# Patient Record
Sex: Female | Born: 2000 | Race: White | Hispanic: No | Marital: Single | Smoking: Never smoker
Health system: Southern US, Community
[De-identification: ages and names within clinical notes are randomized; demographics above are authoritative.]

## PROBLEM LIST (undated history)

## (undated) ENCOUNTER — Inpatient Hospital Stay (HOSPITAL_COMMUNITY): Payer: Self-pay

## (undated) DIAGNOSIS — Z789 Other specified health status: Secondary | ICD-10-CM

## (undated) HISTORY — DX: Other specified health status: Z78.9

---
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
## 2018-01-30 DIAGNOSIS — Z01419 Encounter for gynecological examination (general) (routine) without abnormal findings: Secondary | ICD-10-CM | POA: Diagnosis not present

## 2018-01-30 DIAGNOSIS — Z6839 Body mass index (BMI) 39.0-39.9, adult: Secondary | ICD-10-CM | POA: Diagnosis not present

## 2018-01-30 DIAGNOSIS — Z113 Encounter for screening for infections with a predominantly sexual mode of transmission: Secondary | ICD-10-CM | POA: Diagnosis not present

## 2018-05-07 DIAGNOSIS — Z3202 Encounter for pregnancy test, result negative: Secondary | ICD-10-CM | POA: Diagnosis not present

## 2018-05-07 DIAGNOSIS — Z30017 Encounter for initial prescription of implantable subdermal contraceptive: Secondary | ICD-10-CM | POA: Diagnosis not present

## 2018-12-03 DIAGNOSIS — F418 Other specified anxiety disorders: Secondary | ICD-10-CM | POA: Diagnosis not present

## 2019-01-08 ENCOUNTER — Other Ambulatory Visit: Payer: Self-pay

## 2019-01-08 DIAGNOSIS — U071 COVID-19: Secondary | ICD-10-CM

## 2019-01-09 LAB — NOVEL CORONAVIRUS, NAA: SARS-CoV-2, NAA: NOT DETECTED

## 2019-02-04 DIAGNOSIS — F418 Other specified anxiety disorders: Secondary | ICD-10-CM | POA: Diagnosis not present

## 2019-02-04 DIAGNOSIS — N898 Other specified noninflammatory disorders of vagina: Secondary | ICD-10-CM | POA: Diagnosis not present

## 2019-02-04 DIAGNOSIS — Z309 Encounter for contraceptive management, unspecified: Secondary | ICD-10-CM | POA: Diagnosis not present

## 2019-02-04 DIAGNOSIS — Z01419 Encounter for gynecological examination (general) (routine) without abnormal findings: Secondary | ICD-10-CM | POA: Diagnosis not present

## 2019-02-04 DIAGNOSIS — Z23 Encounter for immunization: Secondary | ICD-10-CM | POA: Diagnosis not present

## 2019-02-04 DIAGNOSIS — Z113 Encounter for screening for infections with a predominantly sexual mode of transmission: Secondary | ICD-10-CM | POA: Diagnosis not present

## 2019-02-04 DIAGNOSIS — Z6832 Body mass index (BMI) 32.0-32.9, adult: Secondary | ICD-10-CM | POA: Diagnosis not present

## 2019-04-07 DIAGNOSIS — Z23 Encounter for immunization: Secondary | ICD-10-CM | POA: Diagnosis not present

## 2019-05-21 ENCOUNTER — Encounter: Payer: Self-pay | Admitting: Family Medicine

## 2019-05-22 ENCOUNTER — Encounter: Payer: Self-pay | Admitting: Family Medicine

## 2019-06-27 DIAGNOSIS — Z113 Encounter for screening for infections with a predominantly sexual mode of transmission: Secondary | ICD-10-CM | POA: Diagnosis not present

## 2019-06-27 DIAGNOSIS — F418 Other specified anxiety disorders: Secondary | ICD-10-CM | POA: Diagnosis not present

## 2019-06-27 DIAGNOSIS — Z309 Encounter for contraceptive management, unspecified: Secondary | ICD-10-CM | POA: Diagnosis not present

## 2019-06-27 DIAGNOSIS — E669 Obesity, unspecified: Secondary | ICD-10-CM | POA: Diagnosis not present

## 2019-06-27 DIAGNOSIS — N926 Irregular menstruation, unspecified: Secondary | ICD-10-CM | POA: Diagnosis not present

## 2019-07-10 DIAGNOSIS — Z309 Encounter for contraceptive management, unspecified: Secondary | ICD-10-CM | POA: Diagnosis not present

## 2019-07-10 DIAGNOSIS — Z3046 Encounter for surveillance of implantable subdermal contraceptive: Secondary | ICD-10-CM | POA: Diagnosis not present

## 2019-09-03 ENCOUNTER — Emergency Department (HOSPITAL_COMMUNITY)
Admission: EM | Admit: 2019-09-03 | Discharge: 2019-09-04 | Disposition: A | Discharge status: Home / Self Care | Payer: BC Managed Care – PPO | Attending: Emergency Medicine | Admitting: Emergency Medicine

## 2019-09-03 ENCOUNTER — Encounter (HOSPITAL_COMMUNITY): Payer: Self-pay

## 2019-09-03 DIAGNOSIS — R112 Nausea with vomiting, unspecified: Secondary | ICD-10-CM | POA: Insufficient documentation

## 2019-09-03 DIAGNOSIS — R1013 Epigastric pain: Secondary | ICD-10-CM | POA: Diagnosis not present

## 2019-09-03 DIAGNOSIS — R197 Diarrhea, unspecified: Secondary | ICD-10-CM

## 2019-09-03 DIAGNOSIS — R1011 Right upper quadrant pain: Secondary | ICD-10-CM | POA: Diagnosis not present

## 2019-09-03 DIAGNOSIS — R1012 Left upper quadrant pain: Secondary | ICD-10-CM | POA: Diagnosis not present

## 2019-09-03 LAB — COMPREHENSIVE METABOLIC PANEL
ALT: 14 U/L (ref 0–44)
AST: 20 U/L (ref 15–41)
Albumin: 3.7 g/dL (ref 3.5–5.0)
Alkaline Phosphatase: 69 U/L (ref 38–126)
Anion gap: 12 (ref 5–15)
BUN: 13 mg/dL (ref 6–20)
CO2: 22 mmol/L (ref 22–32)
Calcium: 9.1 mg/dL (ref 8.9–10.3)
Chloride: 106 mmol/L (ref 98–111)
Creatinine, Ser: 0.97 mg/dL (ref 0.44–1.00)
GFR calc Af Amer: >60 mL/min (ref >60)
GFR calc non Af Amer: >60 mL/min (ref >60)
Glucose, Bld: 141 mg/dL — ABNORMAL HIGH (ref 70–99)
Potassium: 4 mmol/L (ref 3.5–5.1)
Sodium: 140 mmol/L (ref 135–145)
Total Bilirubin: 0.4 mg/dL (ref 0.3–1.2)
Total Protein: 6.6 g/dL (ref 6.5–8.1)

## 2019-09-03 LAB — CBC
HCT: 45.4 % (ref 36.0–46.0)
Hemoglobin: 14.4 g/dL (ref 12.0–15.0)
MCH: 28.2 pg (ref 26.0–34.0)
MCHC: 31.7 g/dL (ref 30.0–36.0)
MCV: 89 fL (ref 80.0–100.0)
Platelets: 324 10*3/uL (ref 150–400)
RBC: 5.1 MIL/uL (ref 3.87–5.11)
RDW: 12.8 % (ref 11.5–15.5)
WBC: 15.9 10*3/uL — ABNORMAL HIGH (ref 4.0–10.5)
nRBC: 0 % (ref 0.0–0.2)

## 2019-09-03 LAB — I-STAT BETA HCG BLOOD, ED (MC, WL, AP ONLY): I-stat hCG, quantitative: <5 m[IU]/mL (ref <5)

## 2019-09-03 LAB — LIPASE, BLOOD: Lipase: 27 U/L (ref 11–51)

## 2019-09-03 MED ORDER — SODIUM CHLORIDE 0.9% FLUSH: 3.0000 mL | Freq: Once | INTRAVENOUS | Status: DC

## 2019-09-03 MED ORDER — ONDANSETRON 4 MG PO TBDP
4.0000 mg | ORAL_TABLET | Freq: Once | ORAL | Status: AC
  Administered 2019-09-03: 4 mg via ORAL
  Filled 2019-09-03: qty 1

## 2019-09-03 NOTE — ED Triage Notes (Signed)
Pt reports that she has been having upper abd pain all day with vomiting.

## 2019-09-03 NOTE — ED Notes (Signed)
Called pt for triage no answer x1

## 2019-09-04 ENCOUNTER — Emergency Department (HOSPITAL_COMMUNITY): Payer: BC Managed Care – PPO

## 2019-09-04 DIAGNOSIS — R1011 Right upper quadrant pain: Secondary | ICD-10-CM | POA: Diagnosis not present

## 2019-09-04 MED ORDER — ALUM & MAG HYDROXIDE-SIMETH 200-200-20 MG/5ML PO SUSP
30.0000 mL | Freq: Once | ORAL | Status: AC
  Administered 2019-09-04: 30 mL via ORAL
  Filled 2019-09-04: qty 30

## 2019-09-04 MED ORDER — MORPHINE SULFATE (PF) 4 MG/ML IV SOLN
4.0000 mg | Freq: Once | INTRAVENOUS | Status: AC
  Administered 2019-09-04: 4 mg via INTRAVENOUS
  Filled 2019-09-04: qty 1

## 2019-09-04 MED ORDER — ONDANSETRON HCL 4 MG/2ML IJ SOLN
4.0000 mg | Freq: Once | INTRAMUSCULAR | Status: AC
  Administered 2019-09-04: 4 mg via INTRAVENOUS
  Filled 2019-09-04: qty 2

## 2019-09-04 MED ORDER — ONDANSETRON 4 MG PO TBDP
4.0000 mg | ORAL_TABLET | Freq: Three times a day (TID) | ORAL | 0 refills | Status: DC | PRN
Start: 2019-09-04 — End: 2020-10-22

## 2019-09-04 MED ORDER — SODIUM CHLORIDE 0.9 % IV BOLUS
1000.0000 mL | Freq: Once | INTRAVENOUS | Status: AC
  Administered 2019-09-04: 1000 mL via INTRAVENOUS

## 2019-09-04 NOTE — Discharge Instructions (Signed)
Try zantac or pepcid twice a day.  Try to avoid things that may make this worse, most commonly these are spicy foods tomato based products fatty foods chocolate and peppermint.  Alcohol and tobacco can also make this worse.  Return to the emergency department for sudden worsening pain or inability to eat or drink.  You can take Imodium for diarrhea.

## 2019-09-04 NOTE — ED Provider Notes (Signed)
[facility] Provider Note   CSN: [id number] Arrival date & time: 09/03/19  1921     History Chief Complaint  Patient presents with  . Abdominal Pain    Leslie Leonard is a 19 y.o. female.  19 yo F with a chief complaints of epigastric abdominal pain.  Going on all day today.  Had nausea and vomiting and fever with it.  Pain is improved somewhat.  Was really severe earlier today.  Like her vomiting was continuous.  She had a couple episodes of loose stools as well.  No sick contacts no suspicious food intake.  Her mother has a history of gallbladder disease and had her gallbladder removed in her early 71s.  Was concerned about this and so sent her here for evaluation.  Seem to improve with sitting up and was worse recumbent.  The history is provided by the patient.  Abdominal Pain Pain location:  RUQ, epigastric and LUQ Pain quality: sharp and shooting   Pain radiates to:  Does not radiate Pain severity:  Moderate Onset quality:  Sudden Duration:  1 day Timing:  Constant Progression:  Partially resolved Chronicity:  Recurrent Relieved by:  Position changes Worsened by:  Position changes Ineffective treatments:  None tried Associated symptoms: diarrhea, nausea and vomiting   Associated symptoms: no chest pain, no chills, no dysuria, no fever and no shortness of breath        History reviewed. No pertinent past medical history.  There are no problems to display for this patient.   History reviewed. No pertinent surgical history.   OB History   No obstetric history on file.     No family history on file.  Social History   Tobacco Use  . Smoking status: Never Smoker  . Smokeless tobacco: Never Used  Substance Use Topics  . Alcohol use: Not on file  . Drug use: Not on file    Home Medications Prior to Admission medications   Medication Sig Start Date End Date Taking? Authorizing Provider  azithromycin (ZITHROMAX Z-PAK)  250 MG tablet Take 2 tablets (500 mg) on  Day 1,  followed by 1 tablet (250 mg) once daily on Days 2 through 5. 02/14/17   Mikey Kirschner, MD  ondansetron (ZOFRAN ODT) 4 MG disintegrating tablet Take 1 tablet (4 mg total) by mouth every 8 (eight) hours as needed for nausea or vomiting. 09/04/19   Deno Etienne, DO    Allergies    Patient has no known allergies.  Review of Systems   Review of Systems  Constitutional: Negative for chills and fever.  HENT: Negative for congestion and rhinorrhea.   Eyes: Negative for redness and visual disturbance.  Respiratory: Negative for shortness of breath and wheezing.   Cardiovascular: Negative for chest pain and palpitations.  Gastrointestinal: Positive for abdominal pain, diarrhea, nausea and vomiting.  Genitourinary: Negative for dysuria and urgency.  Musculoskeletal: Negative for arthralgias and myalgias.  Skin: Negative for pallor and wound.  Neurological: Negative for dizziness and headaches.    Physical Exam Updated Vital Signs BP 124/67   Pulse 97   Temp (!) 100.5 F (38.1 C)   Resp 16   SpO2 96%   Physical Exam Vitals and nursing note reviewed.  Constitutional:      General: She is not in acute distress.    Appearance: She is well-developed. She is not diaphoretic.  HENT:     Head: Normocephalic and atraumatic.  Eyes:  Pupils: Pupils are equal, round, and reactive to light.  Cardiovascular:     Rate and Rhythm: Normal rate and regular rhythm.     Heart sounds: No murmur. No friction rub. No gallop.   Pulmonary:     Effort: Pulmonary effort is normal.     Breath sounds: No wheezing or rales.  Abdominal:     General: There is no distension.     Palpations: Abdomen is soft.     Tenderness: There is abdominal tenderness.     Comments: Mild tenderness to the upper abdomen.  Negative Murphy sign.  Musculoskeletal:        General: No tenderness.     Cervical back: Normal range of motion and neck supple.  Skin:    General:  Skin is warm and dry.  Neurological:     Mental Status: She is alert and oriented to person, place, and time.  Psychiatric:        Behavior: Behavior normal.     ED Results / Procedures / Treatments   Labs (all labs ordered are listed, but only abnormal results are displayed) Labs Reviewed  COMPREHENSIVE METABOLIC PANEL - Abnormal; Notable for the following components:      Result Value   Glucose, Bld 141 (*)    All other components within normal limits  CBC - Abnormal; Notable for the following components:   WBC 15.9 (*)    All other components within normal limits  LIPASE, BLOOD  URINALYSIS, ROUTINE W REFLEX MICROSCOPIC  I-STAT BETA HCG BLOOD, ED ([facility], [facility], [facility] ONLY)    EKG None  Radiology US Abdomen Limited RUQ  Result Date: 09/04/2019 CLINICAL DATA:  Right upper quadrant pain for 2 days EXAM: ULTRASOUND ABDOMEN LIMITED RIGHT UPPER QUADRANT COMPARISON:  None. FINDINGS: Gallbladder: No gallstones or wall thickening visualized. No sonographic Murphy sign noted by sonographer. Common bile duct: Diameter: 5 mm Liver: No focal lesion identified. Within normal limits in parenchymal echogenicity. Portal vein is patent on color Doppler imaging with normal direction of blood flow towards the liver. Other: None. IMPRESSION: Normal examination Electronically Signed   By: Prudencio Pair M.D.   On: 09/04/2019 01:51    Procedures Procedures (including critical care time)  Medications Ordered in ED Medications  sodium chloride flush (NS) 0.9 % injection 3 mL (has no administration in time range)  ondansetron (ZOFRAN-ODT) disintegrating tablet 4 mg (4 mg Oral Given 09/03/19 2140)  morphine 4 MG/ML injection 4 mg (4 mg Intravenous Given 09/04/19 0114)  ondansetron (ZOFRAN) injection 4 mg (4 mg Intravenous Given 09/04/19 0114)  sodium chloride 0.9 % bolus 1,000 mL (1,000 mLs Intravenous New Bag/Given 09/04/19 0113)  alum & mag hydroxide-simeth (MAALOX/MYLANTA) 200-200-20 MG/5ML suspension 30 mL  (30 mLs Oral Given 09/04/19 0114)    ED Course  I have reviewed the triage vital signs and the nursing notes.  Pertinent labs & imaging results that were available during my care of the patient were reviewed by me and considered in my medical decision making (see chart for details).    MDM Rules/Calculators/A&P                      19 yo F with a chief complaints of epigastric abdominal pain nausea vomiting diarrhea and fever.  Going on for about 12 hours now.  Patient is febrile here 100.5.  Has a leukocytosis.  She is complaining of pain mostly in the right upper quadrant.  LFTs and lipase are unremarkable.  Will obtain right upper quadrant ultrasound.  Ultrasound is negative for gallstones or other signs of gallbladder infection.  Patient continues to feel much better.  Will treat as possible viral syndrome.  Have her follow-up with her family doctor.  2:13 AM:  I have discussed the diagnosis/risks/treatment options with the patient and believe the pt to be eligible for discharge home to follow-up with PCP. We also discussed returning to the ED immediately if new or worsening sx occur. We discussed the sx which are most concerning (e.g., sudden worsening pain, fever, inability to tolerate by mouth) that necessitate immediate return. Medications administered to the patient during their visit and any new prescriptions provided to the patient are listed below.  Medications given during this visit Medications  sodium chloride flush (NS) 0.9 % injection 3 mL (has no administration in time range)  ondansetron (ZOFRAN-ODT) disintegrating tablet 4 mg (4 mg Oral Given 09/03/19 2140)  morphine 4 MG/ML injection 4 mg (4 mg Intravenous Given 09/04/19 0114)  ondansetron (ZOFRAN) injection 4 mg (4 mg Intravenous Given 09/04/19 0114)  sodium chloride 0.9 % bolus 1,000 mL (1,000 mLs Intravenous New Bag/Given 09/04/19 0113)  alum & mag hydroxide-simeth (MAALOX/MYLANTA) 200-200-20 MG/5ML suspension 30 mL (30  mLs Oral Given 09/04/19 0114)     The patient appears reasonably screen and/or stabilized for discharge and I doubt any other medical condition or other [facility] requiring further screening, evaluation, or treatment in the ED at this time prior to discharge.   Final Clinical Impression(s) / ED Diagnoses Final diagnoses:  Abdominal pain, RUQ  Nausea vomiting and diarrhea    Rx / DC Orders ED Discharge Orders         Ordered    ondansetron (ZOFRAN ODT) 4 MG disintegrating tablet  Every 8 hours PRN     09/04/19 Mathews, Sheridan, DO 09/04/19 [id number]

## 2019-09-04 NOTE — ED Notes (Signed)
Patient verbalizes understanding of discharge instructions. Opportunity for questioning and answers were provided. Armband removed by staff, pt discharged from ED ambulatory.   

## 2019-09-04 NOTE — ED Notes (Signed)
Pt taken to US via stretcher in stable condition with face mask in place. 

## 2019-09-05 ENCOUNTER — Encounter: Payer: Self-pay | Admitting: Family Medicine

## 2019-12-08 ENCOUNTER — Ambulatory Visit: Payer: BC Managed Care – PPO | Admitting: Family Medicine

## 2019-12-08 ENCOUNTER — Other Ambulatory Visit: Payer: Self-pay

## 2019-12-08 ENCOUNTER — Encounter: Payer: Self-pay | Admitting: Family Medicine

## 2019-12-08 VITALS — BP 118/84 | HR 85 | Temp 98.4°F | Ht 63.5 in | Wt 189.6 lb

## 2019-12-08 DIAGNOSIS — L239 Allergic contact dermatitis, unspecified cause: Secondary | ICD-10-CM | POA: Insufficient documentation

## 2019-12-08 MED ORDER — TRIAMCINOLONE ACETONIDE 0.1 % EX CREA
1.0000 | TOPICAL_CREAM | Freq: Two times a day (BID) | CUTANEOUS | 0 refills | Status: DC
Start: 2019-12-08 — End: 2020-10-22

## 2019-12-08 NOTE — Progress Notes (Signed)
   Patient ID: Leslie Leonard, female    DOB: [birth year], 19 y.o.   MRN: [id number]   Chief Complaint  Patient presents with  . Rash   Subjective:    HPI  Patient comes in today with complaints of a rash on both arms and chest x several days. Rash itches and seems to be spreading. Patient unaware of any changes in routine.   Medical History Carle has no past medical history on file.   Outpatient Encounter Medications as of 12/08/2019  Medication Sig  . etonogestrel (NEXPLANON) 68 MG IMPL implant Nexplanon 68 mg subdermal implant  Inject 1 implant by subcutaneous route.  . ondansetron (ZOFRAN ODT) 4 MG disintegrating tablet Take 1 tablet (4 mg total) by mouth every 8 (eight) hours as needed for nausea or vomiting. (Patient not taking: Reported on 12/08/2019)  . [DISCONTINUED] azithromycin (ZITHROMAX Z-PAK) 250 MG tablet Take 2 tablets (500 mg) on  Day 1,  followed by 1 tablet (250 mg) once daily on Days 2 through 5.   No facility-administered encounter medications on file as of 12/08/2019.     Review of Systems  Constitutional: Negative for fever.  Skin: Positive for rash.       Raised bumps on both arms and right shoulder and a small place on back of neck. Itches. Does not look like bites or poison oak/ivy.  Allergic/Immunologic: Negative for environmental allergies and food allergies.     Vitals BP 118/84   Pulse 85   Temp 98.4 F (36.9 C)   Ht 5' 3.5" (1.613 m)   Wt 189 lb 9.6 oz (86 kg)   SpO2 99%   BMI 33.06 kg/m   Objective:   Physical Exam Constitutional:      Appearance: Normal appearance.  Pulmonary:     Effort: Pulmonary effort is normal.  Skin:    General: Skin is warm and dry.     Findings: Rash present.     Comments: Raised bumps on bil arms and right shoulder. Slight erythema on some of the bumps. No drainage, no warmth to touch. Denies changing soap, detergent, new clothes, new foods, new medications. No one in the home has this (only her).    Neurological:     Mental Status: She is alert.   Does not look like bites or poison ivy/oak.    Assessment and Plan   1. Allergic dermatitis   Will keep food log to see if she is allergic to something she has been eating. Triamcinolone 0.1% cream ordered to use topically twice per day.  Agrees with plan of care discussed today. Understands warning signs to seek further care: rash/itching worsen, or if develops fever. Understands to follow-up if symptoms do not improve or if anything changes and we will discuss next steps for treatment options, such as a short burst of prednisone.  Encouraged her to try to figure out the cause so it can be avoided.  Pecolia Ades, NP

## 2019-12-08 NOTE — Patient Instructions (Signed)
Use the Kenalog twice per day. If you are  Not better in one week, return to discuss treatment options. Keep food log to make sure you are not allergic to something you've always eaten.    Contact Dermatitis Dermatitis is redness, soreness, and swelling (inflammation) of the skin. Contact dermatitis is a reaction to something that touches the skin. There are two types of contact dermatitis:  Irritant contact dermatitis. This happens when something bothers (irritates) your skin, like soap.  Allergic contact dermatitis. This is caused when you are exposed to something that you are allergic to, such as poison ivy. What are the causes?  Common causes of irritant contact dermatitis include: ? Makeup. ? Soaps. ? Detergents. ? Bleaches. ? Acids. ? Metals, such as nickel.  Common causes of allergic contact dermatitis include: ? Plants. ? Chemicals. ? Jewelry. ? Latex. ? Medicines. ? Preservatives in products, such as clothing. What increases the risk?  Having a job that exposes you to things that bother your skin.  Having asthma or eczema. What are the signs or symptoms? Symptoms may happen anywhere the irritant has touched your skin. Symptoms include:  Dry or flaky skin.  Redness.  Cracks.  Itching.  Pain or a burning feeling.  Blisters.  Blood or clear fluid draining from skin cracks. With allergic contact dermatitis, swelling may occur. This may happen in places such as the eyelids, mouth, or genitals. How is this treated?  This condition is treated by checking for the cause of the reaction and protecting your skin. Treatment may also include: ? Steroid creams, ointments, or medicines. ? Antibiotic medicines or other ointments, if you have a skin infection. ? Lotion or medicines to help with itching. ? A bandage (dressing). Follow these instructions at home: Skin care  Moisturize your skin as needed.  Put cool cloths on your skin.  Put a baking soda paste  on your skin. Stir water into baking soda until it looks like a paste.  Do not scratch your skin.  Avoid having things rub up against your skin.  Avoid the use of soaps, perfumes, and dyes. Medicines  Take or apply over-the-counter and prescription medicines only as told by your doctor.  If you were prescribed an antibiotic medicine, take or apply it as told by your doctor. Do not stop using it even if your condition starts to get better. Bathing  Take a bath with: ? Epsom salts. ? Baking soda. ? Colloidal oatmeal.  Bathe less often.  Bathe in warm water. Avoid using hot water. Bandage care  If you were given a bandage, change it as told by your health care provider.  Wash your hands with soap and water before and after you change your bandage. If soap and water are not available, use hand sanitizer. General instructions  Avoid the things that caused your reaction. If you do not know what caused it, keep a journal. Write down: ? What you eat. ? What skin products you use. ? What you drink. ? What you wear in the area that has symptoms. This includes jewelry.  Check the affected areas every day for signs of infection. Check for: ? More redness, swelling, or pain. ? More fluid or blood. ? Warmth. ? Pus or a bad smell.  Keep all follow-up visits as told by your doctor. This is important. Contact a doctor if:  You do not get better with treatment.  Your condition gets worse.  You have signs of infection, such as: ?  More swelling. ? Tenderness. ? More redness. ? Soreness. ? Warmth.  You have a fever.  You have new symptoms. Get help right away if:  You have a very bad headache.  You have neck pain.  Your neck is stiff.  You throw up (vomit).  You feel very sleepy.  You see red streaks coming from the area.  Your bone or joint near the area hurts after the skin has healed.  The area turns darker.  You have trouble breathing. Summary  Dermatitis  is redness, soreness, and swelling of the skin.  Symptoms may occur where the irritant has touched you.  Treatment may include medicines and skin care.  If you do not know what caused your reaction, keep a journal.  Contact a doctor if your condition gets worse or you have signs of infection. This information is not intended to replace advice given to you by your health care provider. Make sure you discuss any questions you have with your health care provider. Document Revised: 07/31/2018 Document Reviewed: 10/24/2017 Elsevier Patient Education  [place].

## 2020-02-11 DIAGNOSIS — F418 Other specified anxiety disorders: Secondary | ICD-10-CM | POA: Diagnosis not present

## 2020-02-11 DIAGNOSIS — Z6835 Body mass index (BMI) 35.0-35.9, adult: Secondary | ICD-10-CM | POA: Diagnosis not present

## 2020-02-11 DIAGNOSIS — Z113 Encounter for screening for infections with a predominantly sexual mode of transmission: Secondary | ICD-10-CM | POA: Diagnosis not present

## 2020-02-11 DIAGNOSIS — Z309 Encounter for contraceptive management, unspecified: Secondary | ICD-10-CM | POA: Diagnosis not present

## 2020-02-11 DIAGNOSIS — N760 Acute vaginitis: Secondary | ICD-10-CM | POA: Diagnosis not present

## 2020-02-11 DIAGNOSIS — Z01419 Encounter for gynecological examination (general) (routine) without abnormal findings: Secondary | ICD-10-CM | POA: Diagnosis not present

## 2020-03-22 ENCOUNTER — Encounter: Payer: BC Managed Care – PPO | Admitting: Adult Health

## 2020-03-25 DIAGNOSIS — F418 Other specified anxiety disorders: Secondary | ICD-10-CM | POA: Diagnosis not present

## 2020-03-25 DIAGNOSIS — N911 Secondary amenorrhea: Secondary | ICD-10-CM | POA: Diagnosis not present

## 2020-03-29 DIAGNOSIS — Z3481 Encounter for supervision of other normal pregnancy, first trimester: Secondary | ICD-10-CM | POA: Diagnosis not present

## 2020-03-29 DIAGNOSIS — Z3685 Encounter for antenatal screening for Streptococcus B: Secondary | ICD-10-CM | POA: Diagnosis not present

## 2020-03-29 LAB — OB RESULTS CONSOLE HIV ANTIBODY (ROUTINE TESTING): HIV: NONREACTIVE

## 2020-03-29 LAB — OB RESULTS CONSOLE HEPATITIS B SURFACE ANTIGEN: Hepatitis B Surface Ag: NEGATIVE

## 2020-03-29 LAB — OB RESULTS CONSOLE ABO/RH: RH Type: NEGATIVE

## 2020-03-29 LAB — OB RESULTS CONSOLE ANTIBODY SCREEN: Antibody Screen: NEGATIVE

## 2020-03-29 LAB — OB RESULTS CONSOLE GC/CHLAMYDIA
Chlamydia: NEGATIVE
Gonorrhea: NEGATIVE

## 2020-03-29 LAB — OB RESULTS CONSOLE RUBELLA ANTIBODY, IGM: Rubella: IMMUNE

## 2020-03-29 LAB — OB RESULTS CONSOLE RPR: RPR: NONREACTIVE

## 2020-04-02 DIAGNOSIS — Z3400 Encounter for supervision of normal first pregnancy, unspecified trimester: Secondary | ICD-10-CM | POA: Diagnosis not present

## 2020-04-02 DIAGNOSIS — Z113 Encounter for screening for infections with a predominantly sexual mode of transmission: Secondary | ICD-10-CM | POA: Diagnosis not present

## 2020-04-21 DIAGNOSIS — Z36 Encounter for antenatal screening for chromosomal anomalies: Secondary | ICD-10-CM | POA: Diagnosis not present

## 2020-04-21 DIAGNOSIS — Z3A12 12 weeks gestation of pregnancy: Secondary | ICD-10-CM | POA: Diagnosis not present

## 2020-04-21 DIAGNOSIS — Z3682 Encounter for antenatal screening for nuchal translucency: Secondary | ICD-10-CM | POA: Diagnosis not present

## 2020-04-24 NOTE — L&D Delivery Note (Addendum)
Delivery Note At 7:08 AM a viable female was delivered via Vaginal, Spontaneous (Presentation: Right Occiput Anterior).  APGAR: 9, 9; weight  .   Placenta status: Spontaneous, Intact.  Cord: 3 vessels with the following complications: None.  Cord pH: collected  Anesthesia: Epidural Episiotomy: None Lacerations: 2nd degree, RV exam confirms Suture Repair: 2.0 Est. Blood Loss (mL):  300 cc  Mom to postpartum.  Baby to Couplet care / Skin to Skin.  Carlyon Shadow 10/22/2020, 7:51 AM

## 2020-06-02 DIAGNOSIS — Z3A18 18 weeks gestation of pregnancy: Secondary | ICD-10-CM | POA: Diagnosis not present

## 2020-06-02 DIAGNOSIS — Z363 Encounter for antenatal screening for malformations: Secondary | ICD-10-CM | POA: Diagnosis not present

## 2020-07-26 DIAGNOSIS — Z362 Encounter for other antenatal screening follow-up: Secondary | ICD-10-CM | POA: Diagnosis not present

## 2020-07-26 DIAGNOSIS — O99212 Obesity complicating pregnancy, second trimester: Secondary | ICD-10-CM | POA: Diagnosis not present

## 2020-07-26 DIAGNOSIS — Z3A26 26 weeks gestation of pregnancy: Secondary | ICD-10-CM | POA: Diagnosis not present

## 2020-07-26 DIAGNOSIS — Z3480 Encounter for supervision of other normal pregnancy, unspecified trimester: Secondary | ICD-10-CM | POA: Diagnosis not present

## 2020-09-28 DIAGNOSIS — Z3685 Encounter for antenatal screening for Streptococcus B: Secondary | ICD-10-CM | POA: Diagnosis not present

## 2020-09-28 LAB — OB RESULTS CONSOLE GBS: GBS: NEGATIVE

## 2020-10-21 ENCOUNTER — Inpatient Hospital Stay (HOSPITAL_COMMUNITY)
Admission: AD | Admit: 2020-10-21 | Discharge: 2020-10-24 | DRG: 807 | Disposition: A | Discharge status: Home / Self Care | Payer: BC Managed Care – PPO | Attending: Obstetrics & Gynecology | Admitting: Obstetrics & Gynecology

## 2020-10-21 ENCOUNTER — Encounter (HOSPITAL_COMMUNITY): Payer: Self-pay | Admitting: *Deleted

## 2020-10-21 ENCOUNTER — Telehealth (HOSPITAL_COMMUNITY): Payer: Self-pay | Admitting: *Deleted

## 2020-10-21 ENCOUNTER — Encounter (HOSPITAL_COMMUNITY): Payer: Self-pay | Admitting: Obstetrics and Gynecology

## 2020-10-21 ENCOUNTER — Other Ambulatory Visit: Payer: Self-pay

## 2020-10-21 ENCOUNTER — Inpatient Hospital Stay (EMERGENCY_DEPARTMENT_HOSPITAL)
Admission: AD | Admit: 2020-10-21 | Discharge: 2020-10-21 | Disposition: A | Discharge status: Home / Self Care | Payer: BC Managed Care – PPO | Source: Home / Self Care | Attending: Obstetrics and Gynecology | Admitting: Obstetrics and Gynecology

## 2020-10-21 DIAGNOSIS — Z3A39 39 weeks gestation of pregnancy: Secondary | ICD-10-CM

## 2020-10-21 DIAGNOSIS — O368130 Decreased fetal movements, third trimester, not applicable or unspecified: Secondary | ICD-10-CM

## 2020-10-21 DIAGNOSIS — O471 False labor at or after 37 completed weeks of gestation: Secondary | ICD-10-CM | POA: Insufficient documentation

## 2020-10-21 DIAGNOSIS — Z20822 Contact with and (suspected) exposure to covid-19: Secondary | ICD-10-CM | POA: Diagnosis present

## 2020-10-21 DIAGNOSIS — Z6791 Unspecified blood type, Rh negative: Secondary | ICD-10-CM | POA: Diagnosis not present

## 2020-10-21 DIAGNOSIS — O368190 Decreased fetal movements, unspecified trimester, not applicable or unspecified: Secondary | ICD-10-CM

## 2020-10-21 DIAGNOSIS — O26893 Other specified pregnancy related conditions, third trimester: Secondary | ICD-10-CM | POA: Diagnosis present

## 2020-10-21 DIAGNOSIS — Z0371 Encounter for suspected problem with amniotic cavity and membrane ruled out: Secondary | ICD-10-CM

## 2020-10-21 DIAGNOSIS — Z79899 Other long term (current) drug therapy: Secondary | ICD-10-CM | POA: Insufficient documentation

## 2020-10-21 HISTORY — DX: Other specified health status: Z78.9

## 2020-10-21 LAB — TYPE AND SCREEN
ABO/RH(D): O NEG
Antibody Screen: NEGATIVE

## 2020-10-21 LAB — CBC
HCT: 37.3 % (ref 36.0–46.0)
Hemoglobin: 12.5 g/dL (ref 12.0–15.0)
MCH: 29.4 pg (ref 26.0–34.0)
MCHC: 33.5 g/dL (ref 30.0–36.0)
MCV: 87.8 fL (ref 80.0–100.0)
Platelets: 255 10*3/uL (ref 150–400)
RBC: 4.25 MIL/uL (ref 3.87–5.11)
RDW: 13.8 % (ref 11.5–15.5)
WBC: 11.8 10*3/uL — ABNORMAL HIGH (ref 4.0–10.5)
nRBC: 0 % (ref 0.0–0.2)

## 2020-10-21 LAB — POCT FERN TEST: POCT Fern Test: NEGATIVE

## 2020-10-21 MED ORDER — SOD CITRATE-CITRIC ACID 500-334 MG/5ML PO SOLN: 30.0000 mL | ORAL | Status: DC | PRN

## 2020-10-21 MED ORDER — OXYTOCIN-SODIUM CHLORIDE 30-0.9 UT/500ML-% IV SOLN: 1.0000 m[IU]/min | INTRAVENOUS | Status: DC

## 2020-10-21 MED ORDER — FLEET ENEMA 7-19 GM/118ML RE ENEM: 1.0000 | ENEMA | RECTAL | Status: DC | PRN

## 2020-10-21 MED ORDER — TERBUTALINE SULFATE 1 MG/ML IJ SOLN: 0.2500 mg | Freq: Once | INTRAMUSCULAR | Status: DC | PRN

## 2020-10-21 MED ORDER — OXYTOCIN-SODIUM CHLORIDE 30-0.9 UT/500ML-% IV SOLN
2.5000 [IU]/h | INTRAVENOUS | Status: DC
  Filled 2020-10-21: qty 500

## 2020-10-21 MED ORDER — OXYCODONE-ACETAMINOPHEN 5-325 MG PO TABS: 1.0000 | ORAL_TABLET | ORAL | Status: DC | PRN

## 2020-10-21 MED ORDER — OXYCODONE-ACETAMINOPHEN 5-325 MG PO TABS
2.0000 | ORAL_TABLET | ORAL | Status: DC | PRN
Start: 2020-10-21 — End: 2020-10-22

## 2020-10-21 MED ORDER — ONDANSETRON HCL 4 MG/2ML IJ SOLN: 4.0000 mg | Freq: Four times a day (QID) | INTRAMUSCULAR | Status: DC | PRN

## 2020-10-21 MED ORDER — ACETAMINOPHEN 325 MG PO TABS: 650.0000 mg | ORAL_TABLET | ORAL | Status: DC | PRN

## 2020-10-21 MED ORDER — LACTATED RINGERS IV SOLN
500.0000 mL | INTRAVENOUS | Status: DC | PRN
  Administered 2020-10-22: 500 mL via INTRAVENOUS

## 2020-10-21 MED ORDER — LIDOCAINE HCL (PF) 1 % IJ SOLN: 30.0000 mL | INTRAMUSCULAR | Status: DC | PRN

## 2020-10-21 MED ORDER — LACTATED RINGERS IV SOLN: INTRAVENOUS | Status: DC

## 2020-10-21 MED ORDER — OXYTOCIN BOLUS FROM INFUSION
333.0000 mL | Freq: Once | INTRAVENOUS | Status: AC
  Administered 2020-10-22: 333 mL via INTRAVENOUS

## 2020-10-21 NOTE — MAU Note (Signed)
Presents c/o decreased FM, states hasn't felt much movement since 1000 this morning.  Also states has had leaking of clear fluid and brownish vaginal discharge.  Reports LOF since 1100 this morning.  Denies VB.

## 2020-10-21 NOTE — H&P (Signed)
OB History and Physical   ICHELLE Leonard is a 20 y.o. female G1P0 at [redacted]w[redacted]d presenting for LOF at [redacted]w[redacted]d.  SROM confirmed in MAU and patient is admitted to labor and delivery.  She reports good fetal movement, denies bleeding, reports ctx q 6 min.  Pregnancy history notable for GBS negative (09/28/20), Rh negative (FOB tested and is also Rh negative   OB History     Gravida  1   Para      Term      Preterm      AB      Living         SAB      IAB      Ectopic      Multiple      Live Births             Past Medical History:  Diagnosis Date   Medical history non-contributory    No past surgical history on file. Family History: family history is not on file. Social History:  reports that she has never smoked. She has never used smokeless tobacco. She reports that she does not drink alcohol and does not use drugs.     Maternal Diabetes: No Genetic Screening: Normal Maternal Ultrasounds/Referrals: Normal Fetal Ultrasounds or other Referrals:  None Maternal Substance Abuse:  No Significant Maternal Medications:  None Significant Maternal Lab Results:  Group B Strep negative and Rh negative Other Comments:  None  Review of Systems Denies fever, chills, SOB, CP, HA, VC History Dilation: 2 Effacement (%): 60 Exam by:: Marcelyn Bruins, RN Blood pressure 139/77, pulse 96, temperature 98 F (36.7 C), resp. rate 18, height 5\' 3"  (1.6 m), weight 119.7 kg. Exam Physical Exam  Gen: alert, well appearing, no distress Chest: nonlabored breathing CV: no peripheral edema Abdomen: soft, nontender Ext: no evidence of DVT FHT cat 1  Prenatal labs: ABO, Rh: --/--/PENDING (06/30 2302) Antibody: PENDING (06/30 2302) Rubella: Immune (12/06 0000) RPR: Nonreactive (12/06 0000)  HBsAg: Negative (12/06 0000)  HIV: Non-reactive (12/06 0000)  GBS:   negative  Assessment/Plan: Admit to Labor and Delivery SROM confirmed in MAU, will augment with pitocin 2x2 GBS negative, no  PCN indicated Rh negative (FOB also Rh engative, Rhogam not indicated) Epidural when desired Anticipate vaginal delivery   Carlyon Shadow 10/21/2020, 11:26 PM

## 2020-10-21 NOTE — Telephone Encounter (Signed)
Preadmission screen  

## 2020-10-21 NOTE — MAU Note (Signed)
Pt reports she had a gush of fluid around 440pm. Clear fluid out has had several gushes since. Ctx q 6 min. Good fetal movement felt.

## 2020-10-21 NOTE — MAU Provider Note (Signed)
History     [id number]  Arrival date and time: 10/21/20 1338    Chief Complaint  Patient presents with   Decreased Fetal Movement   Rupture of Membranes     HPI Leslie Leonard is a 20 y.o. at [redacted]w[redacted]d with PMHx notable for Rh neg, who presents for decreased fetal movement and possible rupture of membranes.   Review of outside prenatal records from [facility] (in media tab): unremarkable course, Rh neg  Patient reports that earlier today she had a gush of clear fluid that soaked her underwear, was not a large amount but was noticaeable Has also had decreased fetal movement all day However since arriving to the MAU this has resolved Intermittent contractions Had vaginal spotting once earlier this week but no frank vaginal bleeding  O/Negative/-- (12/06 0000)  OB History     Gravida  1   Para      Term      Preterm      AB      Living         SAB      IAB      Ectopic      Multiple      Live Births              Past Medical History:  Diagnosis Date   Medical history non-contributory     History reviewed. No pertinent surgical history.  History reviewed. No pertinent family history.  Social History   Socioeconomic History   Marital status: Single    Spouse name: Not on file   Number of children: Not on file   Years of education: Not on file   Highest education level: Not on file  Occupational History   Not on file  Tobacco Use   Smoking status: Never   Smokeless tobacco: Never  Vaping Use   Vaping Use: Never used  Substance and Sexual Activity   Alcohol use: Never   Drug use: Never   Sexual activity: Yes  Other Topics Concern   Not on file  Social History Narrative   Not on file   Social Determinants of Health   Financial Resource Strain: Not on file  Food Insecurity: Not on file  Transportation Needs: Not on file  Physical Activity: Not on file  Stress: Not on file  Social Connections: Not on file  Intimate  Partner Violence: Not on file    No Known Allergies  No current facility-administered medications on file prior to encounter.   Current Outpatient Medications on File Prior to Encounter  Medication Sig Dispense Refill   Prenatal Vit-Fe Fumarate-FA (PRENATAL MULTIVITAMIN) TABS tablet Take 1 tablet by mouth daily at 12 noon.     etonogestrel (NEXPLANON) 68 MG IMPL implant Nexplanon 68 mg subdermal implant  Inject 1 implant by subcutaneous route.     ondansetron (ZOFRAN ODT) 4 MG disintegrating tablet Take 1 tablet (4 mg total) by mouth every 8 (eight) hours as needed for nausea or vomiting. 20 tablet 0   triamcinolone cream (KENALOG) 0.1 % Apply 1 application topically 2 (two) times daily. 30 g 0     ROS Pertinent positives and negative per HPI, all others reviewed and negative  Physical Exam   BP 125/75 (BP Location: Right Arm)   Pulse 98   Temp 98.3 F (36.8 C) (Oral)   Resp 17   Ht 5\' 3"  (1.6 m)   Wt 120 kg   SpO2 97%   BMI  46.87 kg/m   Patient Vitals for the past 24 hrs:  BP Temp Temp src Pulse Resp SpO2 Height Weight  10/21/20 1426 -- -- -- 98 17 -- -- --  10/21/20 1405 125/75 98.3 F (36.8 C) Oral 98 20 97 % -- --  10/21/20 1359 -- -- -- -- -- -- 5\' 3"  (1.6 m) 120 kg    Physical Exam Vitals reviewed.  Constitutional:      General: She is not in acute distress.    Appearance: She is well-developed. She is not diaphoretic.  Eyes:     General: No scleral icterus. Pulmonary:     Effort: Pulmonary effort is normal. No respiratory distress.  Abdominal:     General: There is no distension.     Palpations: Abdomen is soft.     Tenderness: There is no abdominal tenderness. There is no guarding or rebound.  Genitourinary:    Comments: SSE with neg pool, neg fern, cervix visually closed Skin:    General: Skin is warm and dry.  Neurological:     Mental Status: She is alert.     Coordination: Coordination normal.     Cervical Exam    Bedside Ultrasound Not  done  My interpretation: n/a  FHT Baseline 130, moderate variability, +accels, no decels Toco: irregular ctx Cat: I  Labs No results found for this or any previous visit (from the past 24 hour(s)).  Imaging No results found.  MAU Course  Procedures Lab Orders  POCT fern test  No orders of the defined types were placed in this encounter.  Imaging Orders  No imaging studies ordered today    MDM moderate  Assessment and Plan  #Evaluation for rupture of membranes, rupture of membranes not found Neg SSE and fern.  #Decreased fetal movement Resolved since arrival to MAU. NST reactive.  #FWB FHT Cat I NST: Reactive  Discharged to home with routine labor precautions.   Clarnce Flock, MD/MPH 10/21/20 3:14 PM  Allergies as of 10/21/2020   No Known Allergies      Medication List     STOP taking these medications    Nexplanon 68 MG Impl implant Generic drug: etonogestrel       TAKE these medications    ondansetron 4 MG disintegrating tablet Commonly known as: Zofran ODT Take 1 tablet (4 mg total) by mouth every 8 (eight) hours as needed for nausea or vomiting.   prenatal multivitamin Tabs tablet Take 1 tablet by mouth daily at 12 noon.   triamcinolone cream 0.1 % Commonly known as: KENALOG Apply 1 application topically 2 (two) times daily.

## 2020-10-22 ENCOUNTER — Encounter (HOSPITAL_COMMUNITY): Payer: Self-pay | Admitting: Obstetrics and Gynecology

## 2020-10-22 ENCOUNTER — Inpatient Hospital Stay (HOSPITAL_COMMUNITY): Payer: BC Managed Care – PPO | Admitting: Anesthesiology

## 2020-10-22 ENCOUNTER — Other Ambulatory Visit: Payer: Self-pay

## 2020-10-22 LAB — RESP PANEL BY RT-PCR (FLU A&B, COVID) ARPGX2
Influenza A by PCR: NEGATIVE
Influenza B by PCR: NEGATIVE
SARS Coronavirus 2 by RT PCR: NEGATIVE

## 2020-10-22 LAB — RPR: RPR Ser Ql: NONREACTIVE

## 2020-10-22 MED ORDER — TETANUS-DIPHTH-ACELL PERTUSSIS 5-2.5-18.5 LF-MCG/0.5 IM SUSY: 0.5000 mL | PREFILLED_SYRINGE | Freq: Once | INTRAMUSCULAR | Status: DC

## 2020-10-22 MED ORDER — ZOLPIDEM TARTRATE 5 MG PO TABS: 5.0000 mg | ORAL_TABLET | Freq: Every evening | ORAL | Status: DC | PRN

## 2020-10-22 MED ORDER — LACTATED RINGERS IV SOLN
500.0000 mL | Freq: Once | INTRAVENOUS | Status: AC
  Administered 2020-10-22: 500 mL via INTRAVENOUS

## 2020-10-22 MED ORDER — ONDANSETRON HCL 4 MG PO TABS: 4.0000 mg | ORAL_TABLET | ORAL | Status: DC | PRN

## 2020-10-22 MED ORDER — IBUPROFEN 600 MG PO TABS
600.0000 mg | ORAL_TABLET | Freq: Four times a day (QID) | ORAL | Status: DC
  Administered 2020-10-22 – 2020-10-24 (×8): 600 mg via ORAL
  Filled 2020-10-22 (×8): qty 1

## 2020-10-22 MED ORDER — DIBUCAINE (PERIANAL) 1 % EX OINT: 1.0000 "application " | TOPICAL_OINTMENT | CUTANEOUS | Status: DC | PRN

## 2020-10-22 MED ORDER — SIMETHICONE 80 MG PO CHEW: 80.0000 mg | CHEWABLE_TABLET | ORAL | Status: DC | PRN

## 2020-10-22 MED ORDER — PRENATAL MULTIVITAMIN CH
1.0000 | ORAL_TABLET | Freq: Every day | ORAL | Status: DC
  Administered 2020-10-22 – 2020-10-24 (×3): 1 via ORAL
  Filled 2020-10-22 (×3): qty 1

## 2020-10-22 MED ORDER — LIDOCAINE HCL (PF) 1 % IJ SOLN
INTRAMUSCULAR | Status: DC | PRN
  Administered 2020-10-22: 8 mL via EPIDURAL

## 2020-10-22 MED ORDER — PHENYLEPHRINE 40 MCG/ML (10ML) SYRINGE FOR IV PUSH (FOR BLOOD PRESSURE SUPPORT): 80.0000 ug | PREFILLED_SYRINGE | INTRAVENOUS | Status: DC | PRN

## 2020-10-22 MED ORDER — DIPHENHYDRAMINE HCL 25 MG PO CAPS: 25.0000 mg | ORAL_CAPSULE | Freq: Four times a day (QID) | ORAL | Status: DC | PRN

## 2020-10-22 MED ORDER — WITCH HAZEL-GLYCERIN EX PADS: 1.0000 "application " | MEDICATED_PAD | CUTANEOUS | Status: DC | PRN

## 2020-10-22 MED ORDER — ONDANSETRON HCL 4 MG/2ML IJ SOLN: 4.0000 mg | INTRAMUSCULAR | Status: DC | PRN

## 2020-10-22 MED ORDER — BENZOCAINE-MENTHOL 20-0.5 % EX AERO
1.0000 "application " | INHALATION_SPRAY | CUTANEOUS | Status: DC | PRN
  Administered 2020-10-22: 1 via TOPICAL
  Filled 2020-10-22: qty 56

## 2020-10-22 MED ORDER — COCONUT OIL OIL
1.0000 "application " | TOPICAL_OIL | Status: DC | PRN
  Administered 2020-10-23: 1 via TOPICAL

## 2020-10-22 MED ORDER — DIPHENHYDRAMINE HCL 50 MG/ML IJ SOLN: 12.5000 mg | INTRAMUSCULAR | Status: DC | PRN

## 2020-10-22 MED ORDER — SENNOSIDES-DOCUSATE SODIUM 8.6-50 MG PO TABS
2.0000 | ORAL_TABLET | ORAL | Status: DC
  Administered 2020-10-22 – 2020-10-24 (×3): 2 via ORAL
  Filled 2020-10-22 (×3): qty 2

## 2020-10-22 MED ORDER — EPHEDRINE 5 MG/ML INJ: 10.0000 mg | INTRAVENOUS | Status: DC | PRN

## 2020-10-22 MED ORDER — FENTANYL-BUPIVACAINE-NACL 0.5-0.125-0.9 MG/250ML-% EP SOLN
EPIDURAL | Status: AC
  Filled 2020-10-22: qty 250

## 2020-10-22 MED ORDER — FENTANYL CITRATE (PF) 100 MCG/2ML IJ SOLN
50.0000 ug | INTRAMUSCULAR | Status: DC | PRN
Start: 2020-10-22 — End: 2020-10-22
  Administered 2020-10-22: 100 ug via INTRAVENOUS
  Filled 2020-10-22: qty 2

## 2020-10-22 MED ORDER — FENTANYL-BUPIVACAINE-NACL 0.5-0.125-0.9 MG/250ML-% EP SOLN
12.0000 mL/h | EPIDURAL | Status: DC | PRN
  Administered 2020-10-22: 12 mL/h via EPIDURAL

## 2020-10-22 MED ORDER — ACETAMINOPHEN 325 MG PO TABS: 650.0000 mg | ORAL_TABLET | ORAL | Status: DC | PRN

## 2020-10-22 NOTE — Anesthesia Procedure Notes (Signed)
Epidural Patient location during procedure: OB Start time: 10/22/2020 1:26 AM End time: 10/22/2020 1:35 AM  Staffing Anesthesiologist: Darral Dash, DO Performed: anesthesiologist   Preanesthetic Checklist Completed: patient identified, IV checked, site marked, risks and benefits discussed, surgical consent, monitors and equipment checked, pre-op evaluation and timeout performed  Epidural Patient position: sitting Prep: ChloraPrep Patient monitoring: heart rate, continuous pulse ox and blood pressure Approach: midline Location: L3-L4 Injection technique: LOR saline  Needle:  Needle type: Tuohy  Needle gauge: 17 G Needle length: 9 cm Needle insertion depth: 8 cm Catheter type: closed end flexible Catheter size: 20 Guage Catheter at skin depth: 12 cm Test dose: negative and 1.5% lidocaine  Assessment Events: blood not aspirated, injection not painful, no injection resistance and no paresthesia  Additional Notes Patient identified. Risks/Benefits/Options discussed with patient including but not limited to bleeding, infection, nerve damage, paralysis, failed block, incomplete pain control, headache, blood pressure changes, nausea, vomiting, reactions to medications, itching and postpartum back pain. Confirmed with bedside nurse the patient's most recent platelet count. Confirmed with patient that they are not currently taking any anticoagulation, have any bleeding history or any family history of bleeding disorders. Patient expressed understanding and wished to proceed. All questions were answered. Sterile technique was used throughout the entire procedure. Please see nursing notes for vital signs. Test dose was given through epidural catheter and negative prior to continuing to dose epidural or start infusion. Warning signs of high block given to the patient including shortness of breath, tingling/numbness in hands, complete motor block, or any concerning symptoms with instructions  to call for help. Patient was given instructions on fall risk and not to get out of bed. All questions and concerns addressed with instructions to call with any issues or inadequate analgesia.    Reason for block:procedure for pain

## 2020-10-22 NOTE — Anesthesia Preprocedure Evaluation (Signed)
Anesthesia Evaluation  Patient identified by MRN, date of birth, ID band Patient awake    Reviewed: Allergy & Precautions, NPO status , Patient's Chart, lab work & pertinent test results  Airway Mallampati: II  TM Distance: >3 FB Neck ROM: Full    Dental  (+) Teeth Intact   Pulmonary neg pulmonary ROS,    Pulmonary exam normal        Cardiovascular negative cardio ROS   Rhythm:Regular Rate:Normal     Neuro/Psych negative neurological ROS  negative psych ROS   GI/Hepatic negative GI ROS, Neg liver ROS,   Endo/Other  negative endocrine ROS  Renal/GU negative Renal ROS  negative genitourinary   Musculoskeletal negative musculoskeletal ROS (+)   Abdominal (+)  Abdomen: soft. Bowel sounds: normal.  Peds  Hematology negative hematology ROS (+)   Anesthesia Other Findings   Reproductive/Obstetrics (+) Pregnancy                             Anesthesia Physical Anesthesia Plan  ASA: 2  Anesthesia Plan: Epidural   Post-op Pain Management:    Induction:   PONV Risk Score and Plan: 2 and Treatment may vary due to age or medical condition  Airway Management Planned: Natural Airway  Additional Equipment: None  Intra-op Plan:   Post-operative Plan:   Informed Consent: I have reviewed the patients History and Physical, chart, labs and discussed the procedure including the risks, benefits and alternatives for the proposed anesthesia with the patient or authorized representative who has indicated his/her understanding and acceptance.     Dental advisory given  Plan Discussed with:   Anesthesia Plan Comments: (Lab Results      Component                Value               Date                      WBC                      11.8 (H)            10/21/2020                HGB                      12.5                10/21/2020                HCT                      37.3                 10/21/2020                MCV                      87.8                10/21/2020                PLT                      255  10/21/2020          )        Anesthesia Quick Evaluation

## 2020-10-22 NOTE — Lactation Note (Signed)
This note was copied from a baby's chart. Lactation Consultation Note  Patient Name: Leslie Leonard [id number] Date: 10/22/2020 Reason for consult: L&D Initial assessment;Primapara;1st time breastfeeding;Term;Other (Comment) (LC L/D visit at 40 mins PP. baby STS on moms chest, wide awake/ rooting. LC offered to assist w/ Latch/ mom receptive/ baby latched easily w/ depth/ sustained a latch for 17 mins w/swallows. per mom has been hand exp at home with results.) Latch score of 8 see below for details.  Age:78 hours Mom aware she and baby will be seen again later today.    Maternal Data Has patient been taught Hand Expression?: Yes Does the patient have breastfeeding experience prior to this delivery?: No  Feeding Mother's Current Feeding Choice: Breast Milk  LATCH Score Latch: Grasps breast easily, tongue down, lips flanged, rhythmical sucking.  Audible Swallowing: A few with stimulation (increased with breast compressions)  Type of Nipple: Everted at rest and after stimulation  Comfort (Breast/Nipple): Soft / non-tender  Hold (Positioning): Assistance needed to correctly position infant at breast and maintain latch.  LATCH Score: 8   Lactation Tools Discussed/Used    Interventions Interventions: Breast feeding basics reviewed;Assisted with latch;Skin to skin;Hand express;Breast compression;Adjust position;Support pillows;Education (LC recommends shells when baby is not STS due to some areola edema)  Discharge    Consult Status Consult Status: Follow-up (from L/D) Date: 10/22/20 Follow-up type: In-patient    [place] 10/22/2020, 8:17 AM

## 2020-10-22 NOTE — Anesthesia Postprocedure Evaluation (Signed)
Anesthesia Post Note  Patient: Leslie Leonard  Procedure(s) Performed: AN AD HOC LABOR EPIDURAL     Patient location during evaluation: Mother Baby Anesthesia Type: Epidural Level of consciousness: awake and alert and oriented Pain management: satisfactory to patient Vital Signs Assessment: post-procedure vital signs reviewed and stable Respiratory status: respiratory function stable Cardiovascular status: stable Postop Assessment: no headache, no backache, epidural receding, patient able to bend at knees, no signs of nausea or vomiting, adequate PO intake and able to ambulate Anesthetic complications: no   No notable events documented.  Last Vitals:  Vitals:   10/22/20 1131 10/22/20 1520  BP: 130/67 (!) 124/59  Pulse: (!) 118 85  Resp: 18 20  Temp: 37 C 36.5 C  SpO2: 99% 98%    Last Pain:  Vitals:   10/22/20 1724  TempSrc:   PainSc: 0-No pain   Pain Goal:                   [name]

## 2020-10-23 LAB — CBC
HCT: 33.8 % — ABNORMAL LOW (ref 36.0–46.0)
Hemoglobin: 10.8 g/dL — ABNORMAL LOW (ref 12.0–15.0)
MCH: 28.6 pg (ref 26.0–34.0)
MCHC: 32 g/dL (ref 30.0–36.0)
MCV: 89.4 fL (ref 80.0–100.0)
Platelets: 213 10*3/uL (ref 150–400)
RBC: 3.78 MIL/uL — ABNORMAL LOW (ref 3.87–5.11)
RDW: 14.2 % (ref 11.5–15.5)
WBC: 12.3 10*3/uL — ABNORMAL HIGH (ref 4.0–10.5)
nRBC: 0 % (ref 0.0–0.2)

## 2020-10-23 MED ORDER — IBUPROFEN 600 MG PO TABS: 600.0000 mg | ORAL_TABLET | Freq: Four times a day (QID) | ORAL | 0 refills | Status: DC

## 2020-10-23 NOTE — Lactation Note (Addendum)
This note was copied from a baby's chart. Lactation Consultation Note Baby 15 hrs old. Baby starting to cluster feed. Mom having trouble latching. No swallows heard. Baby has thick labial frenulum. Mom has bruised nipples. Mom has coconut oil. Shells given to evert nipples more. Newborn feeding habits, STS, I&O, behavior, positioning, and support. Baby had 6 % weight loss but has had lots of void and poops as well as spitting up. Baby will be monitored, Lactation brochure given. Encouraged to call for assistance as needed.  Patient Name: Leslie Leonard [id number] Date: 10/23/2020 Reason for consult: Initial assessment;Primapara;Term Age:35 hours  Maternal Data Has patient been taught Hand Expression?: Yes Does the patient have breastfeeding experience prior to this delivery?: No  Feeding    LATCH Score Latch: Repeated attempts needed to sustain latch, nipple held in mouth throughout feeding, stimulation needed to elicit sucking reflex.  Audible Swallowing: None  Type of Nipple: Everted at rest and after stimulation (very short shaft)  Comfort (Breast/Nipple): Soft / non-tender  Hold (Positioning): Assistance needed to correctly position infant at breast and maintain latch.  LATCH Score: 6   Lactation Tools Discussed/Used Tools: Pump;Shells Breast pump type: Manual Pump Education: Setup, frequency, and cleaning;Milk Storage Reason for Pumping: pre-pump/short shaft  Interventions Interventions: Breast feeding basics reviewed;Support pillows;Assisted with latch;Position options;Skin to skin;Expressed milk;Breast massage;Coconut oil;Hand express;Shells;Pre-pump if needed;Hand pump;Reverse pressure;Breast compression;DEBP;Adjust position  Discharge [facility] Program: Yes  Consult Status Consult Status: Follow-up Date: 10/23/20 Follow-up type: In-patient    Angely Dietz, Elta Gu[place] 10/23/2020, 5:59 AM

## 2020-10-23 NOTE — Discharge Summary (Signed)
Postpartum Discharge Summary    Patient Name: Leslie Leonard DOB: Dec 18, 2000 MRN: [id number]  Date of admission: 10/21/2020 Delivery date:10/22/2020  Delivering provider: Eyvonne Mechanic A  Date of discharge: 10/23/2020  Admitting diagnosis: Normal labor [O80, Z37.9] Intrauterine pregnancy: [redacted]w[redacted]d    Secondary diagnosis:  Active Problems:   Normal labor  Additional problems: none    Discharge diagnosis: Term Pregnancy Delivered                                              Post partum procedures: none Augmentation:  none Complications: None  Hospital course: Onset of Labor With Vaginal Delivery      20y.o. yo G1P1001 at 381w1das admitted in Latent Labor on 10/21/2020. Patient had an uncomplicated labor course as follows:  Membrane Rupture Time/Date: 4:30 PM ,10/21/2020   Delivery Method:Vaginal, Spontaneous  Episiotomy: None  Lacerations:  2nd degree  Patient had an uncomplicated postpartum course.  She is ambulating, tolerating a regular diet, passing flatus, and urinating well. Patient is discharged home in stable condition on 10/23/20.  Newborn Data: Birth date:10/22/2020  Birth time:7:08 AM  Gender:Female  Living status:Living  Apgars:9 ,9  Weight:3310 g   Magnesium Sulfate received: No BMZ received: No Rhophylac:No MMR:No T-DaP:Given prenatally Flu: No Transfusion:No  Physical exam  Vitals:   10/22/20 1520 10/22/20 1909 10/22/20 2314 10/23/20 0553  BP: (!) 124/59 126/64 111/73 116/68  Pulse: 85 81 96 72  Resp: 20 18 18 18   Temp: 97.7 F (36.5 C) 97.6 F (36.4 C) 97.7 F (36.5 C) 98.5 F (36.9 C)  TempSrc: Oral Oral Oral Oral  SpO2: 98% 99% 98% 99%  Weight:      Height:       General: alert, cooperative, and no distress Lochia: appropriate Uterine Fundus: firm Incision: Healing well with no significant drainage, No significant erythema DVT Evaluation: No evidence of DVT seen on physical exam. Negative Homan's sign. No cords or calf  tenderness. Labs: Lab Results  Component Value Date   WBC 12.3 (H) 10/23/2020   HGB 10.8 (L) 10/23/2020   HCT 33.8 (L) 10/23/2020   MCV 89.4 10/23/2020   PLT 213 10/23/2020   CMP Latest Ref Rng & Units 09/03/2019  Glucose 70 - 99 mg/dL 141(H)  BUN 6 - 20 mg/dL 13  Creatinine 0.44 - 1.00 mg/dL 0.97  Sodium 135 - 145 mmol/L 140  Potassium 3.5 - 5.1 mmol/L 4.0  Chloride 98 - 111 mmol/L 106  CO2 22 - 32 mmol/L 22  Calcium 8.9 - 10.3 mg/dL 9.1  Total Protein 6.5 - 8.1 g/dL 6.6  Total Bilirubin 0.3 - 1.2 mg/dL 0.4  Alkaline Phos 38 - 126 U/L 69  AST 15 - 41 U/L 20  ALT 0 - 44 U/L 14   Edinburgh Score: Edinburgh Postnatal Depression Scale Screening Tool 10/22/2020  I have been able to laugh and see the funny side of things. (No Data)     After visit meds:  Allergies as of 10/23/2020   No Known Allergies      Medication List     TAKE these medications    ibuprofen 600 MG tablet Commonly known as: ADVIL Take 1 tablet (600 mg total) by mouth every 6 (six) hours.   prenatal multivitamin Tabs tablet Take 1 tablet by mouth daily at 12 noon.  Discharge home in stable condition Infant Feeding: Breast Infant Disposition:home with mother Discharge instruction: per After Visit Summary and Postpartum booklet. Activity: Advance as tolerated. Pelvic rest for 6 weeks.  Diet: routine diet Future Appointments:No future appointments. Follow up Visit: 6 weeks PPV  10/23/2020 Linda Hedges, DO

## 2020-10-23 NOTE — Progress Notes (Signed)
CSW met with MOB to complete consult for history of anxiety, and depression. CSW observed MOB resting in bed, FOB laying on couch, grandmother in recliner, and infant at circumcision procedure. MOB gave CSW verbal consent to complete consult while FOB, and grandmother was present. CSW explained role and reason for consult. MOB was pleasant, polite, and engaged with CSW. MOB reported, history of anxiety, and depression in 2019. MOB reported, history of Zoloft, and her last dose being last year prior to pregnancy.   CSW provided education regarding the baby blues period vs. perinatal mood disorders, discussed treatment and gave resources for mental health follow up if concerns arise. CSW recommends self- evaluation during the postpartum time period using the New Mom Checklist from Postpartum Progress and encouraged MOB to contact a medical professional if symptoms are noted at any time.   When CSW asked MOB of her emotions since delivery. MOB reported, she feels, "good". MOB reported, her family and FOB's family are her supports. MOB denied SI, and HI when CSW assessed for safety.   MOB reported, she receive WIC, and food stamps. MOB reported, there are no barriers to follow up infant's care. MOB reported, she has all essentials needed to care for infant. MOB reported, infant has a car seat and bassinet. MOB denied any additional barriers.     CSW provided education on sudden infant death syndrome (SIDS).  CSW identifies no further need for intervention or barriers to discharge at this time.   Darcus Austin, MSW, LCSW-A Clinical Social Worker- Weekends [phone]

## 2020-10-23 NOTE — Lactation Note (Addendum)
This note was copied from a baby's chart. Lactation Consultation Note  Patient Name: Leslie Leonard Date: 10/23/2020 Reason for consult: Follow-up assessment;Mother's request;Difficult latch;Primapara;1st time breastfeeding;Term;Infant weight loss Age:22 hours  Infant thick labial attachment and high palate. At the breast needs assistance flanging out his lips. LC could not get him to elevate tongue to see if had lingual attachment. With suck training, can feel tongue at base of finger. Parents able to do chin tug and flip outer lip to improve latch and decrease pain from 4 to 2.   Mom nipples sore with bruising noted. Mom given comfort gels to wear. Mom rinse in between use and d/c after 6 days. Mom aware to not use them with coconut oil.   Mom breast shells to wear soreness not to use them when pumping, sleeping or nursing.   Plan 1. Feed based on cues 8-12x in 24 hr period, no more than 4 hrs without an attempt. Mom to offer both breasts and latch with help of 5 french and syringe, primes with EBM first then [facility].          2. Breastfeeding supplementation guide provided. Mom to offer more if infant not able to latch.           3. Mom to pump with DEBP q 3 hrs for 15 min.   Maternal Data Has patient been taught Hand Expression?: Yes  Feeding Mother's Current Feeding Choice: Breast Milk and Donor Milk  LATCH Score Latch: Repeated attempts needed to sustain latch, nipple held in mouth throughout feeding, stimulation needed to elicit sucking reflex.  Audible Swallowing: A few with stimulation (Infant not able to transfer from breast with compression. LC used 5 french with DBM 9 ml given. Sporadically he began to do CAH sound at the breast when not pushing the [facility]  Type of Nipple: Everted at rest and after stimulation  Comfort (Breast/Nipple): Filling, red/small blisters or bruises, mild/mod discomfort  Hold (Positioning): Assistance needed to correctly position infant at  breast and maintain latch.  LATCH Score: 6   Lactation Tools Discussed/Used Tools: 63F feeding tube / Syringe;Pump;Flanges;Shells;Coconut oil;Comfort gels Flange Size: 21 Breast pump type: Double-Electric Breast Pump Pump Education: Setup, frequency, and cleaning;Milk Storage Reason for Pumping: increase stimulaton Pumping frequency: every 3 hrs for 15 min  Interventions Interventions: Breast feeding basics reviewed;Breast compression;Assisted with latch;Adjust position;Comfort gels;Skin to skin;Support pillows;DEBP;Breast massage;Hand express;Expressed milk;Education;Position options;Shells;Coconut oil  Discharge Pump: Personal WIC Program: No  Consult Status Consult Status: Follow-up Date: 10/24/20 Follow-up type: In-patient    Leslie Abril  Leonard 10/23/2020, 11:21 AM

## 2020-10-23 NOTE — Discharge Instructions (Signed)
Call MD for T>100.4, heavy vaginal bleeding, severe abdominal pain, nausea and/or vomiting, or respiratory distress.  Call office to schedule postpartum visit in 6 weeks.  Pelvic rest x 6 weeks.

## 2020-10-23 NOTE — Progress Notes (Signed)
Post Partum Day 1 Subjective: no complaints, up ad lib, voiding, and tolerating PO.  Patient requests circ.  Objective: Blood pressure 116/68, pulse 72, temperature 98.5 F (36.9 C), temperature source Oral, resp. rate 18, height 5\' 3"  (1.6 m), weight 119.7 kg, SpO2 99 %, unknown if currently breastfeeding.  Physical Exam:  General: alert, cooperative, and appears stated age 20: appropriate Uterine Fundus: firm Incision: healing well, no significant drainage, no dehiscence DVT Evaluation: No evidence of DVT seen on physical exam. Negative Homan's sign. No cords or calf tenderness.  Recent Labs    10/21/20 2302 10/23/20 0425  HGB 12.5 10.8*  HCT 37.3 33.8*    Assessment/Plan: Discharge home, Breastfeeding, and Circumcision prior to discharge Counseled re: circ including risk of bleeding, infection and scarring.  All questions were answered and will proceed.   LOS: 2 days   Linda Hedges 10/23/2020, 8:22 AM

## 2020-10-24 NOTE — Progress Notes (Signed)
Post Partum Day 2 Subjective: no complaints, up ad lib, voiding, and tolerating PO.  Discharge was held yesterday for baby.  Objective: Blood pressure 114/72, pulse 89, temperature 98.4 F (36.9 C), temperature source Oral, resp. rate 18, height 5\' 3"  (1.6 m), weight 119.7 kg, SpO2 99 %, unknown if currently breastfeeding.  Physical Exam:  General: alert, cooperative, and appears stated age 20: appropriate Uterine Fundus: firm Incision: healing well, no significant drainage, no dehiscence DVT Evaluation: No evidence of DVT seen on physical exam. Negative Homan's sign. No cords or calf tenderness.  Recent Labs    10/21/20 2302 10/23/20 0425  HGB 12.5 10.8*  HCT 37.3 33.8*    Assessment/Plan: Discharge home and Breastfeeding   LOS: 3 days   Leslie Leonard 10/24/2020, 8:00 AM

## 2020-10-24 NOTE — Lactation Note (Addendum)
This note was copied from a baby's chart. Lactation Consultation Note  Patient Name: Leslie Leonard Date: 10/24/2020   Age:22 hours  Feeding at the breast was attempted with supplementing; however, it was unsuccessful & infant kept pulling away. Parents and MGM report that infant does that when supplementing at the breast is attempted. After a few attempts, I suggested bottle-feeding for the time being & parents were in agreement.   Infant did very well with the bottle using the extra-slow flow nipple. Paced bottle-feeding was taught & Dad was able to return demonstration. Parents have [employer] bottles with newborn nipples at home.   Mom's breasts are soft; her R nipple was noted to have 2 compression stripes, the surface of her L nipple was abraded. Mom says they are not worse than yesterday and she is not c/o soreness. Mom has Comfort Gels, etc.   Mom has 2 pumps at home; Mom encouraged to pump whenever infant receives a bottle. Parents encouraged to feed infant until content.  Parents had been feeding infant q4h b/c infant was not waking on his own. Parents know to aim for 8-12 feedings/24 hrs. I anticipate infant will begin cueing more often, as he is now receiving more volume and the manner of feeding is easier.   Parents know how to reach [place] for any post-discharge questions.  Leslie Leonard [facility] 10/24/2020, 8:17 AM

## 2020-10-28 ENCOUNTER — Inpatient Hospital Stay (HOSPITAL_COMMUNITY): Admit: 2020-10-28 | Payer: Self-pay

## 2020-10-29 ENCOUNTER — Other Ambulatory Visit (HOSPITAL_COMMUNITY): Payer: BC Managed Care – PPO

## 2020-11-01 ENCOUNTER — Telehealth (HOSPITAL_COMMUNITY): Payer: Self-pay | Admitting: *Deleted

## 2020-11-01 ENCOUNTER — Inpatient Hospital Stay (HOSPITAL_COMMUNITY): Payer: BC Managed Care – PPO

## 2020-11-01 ENCOUNTER — Inpatient Hospital Stay (HOSPITAL_COMMUNITY)
Admission: AD | Admit: 2020-11-01 | Payer: BC Managed Care – PPO | Source: Home / Self Care | Admitting: Obstetrics and Gynecology

## 2020-11-01 NOTE — Telephone Encounter (Signed)
Hospital discharge follow-up call attempted. Left message for patient to return RN call. Erline Levine, RN, 11/01/20, [phone]

## 2020-11-29 DIAGNOSIS — Z1389 Encounter for screening for other disorder: Secondary | ICD-10-CM | POA: Diagnosis not present

## 2020-11-29 DIAGNOSIS — Z3040 Encounter for surveillance of contraceptives, unspecified: Secondary | ICD-10-CM | POA: Diagnosis not present

## 2020-12-14 DIAGNOSIS — Z3202 Encounter for pregnancy test, result negative: Secondary | ICD-10-CM | POA: Diagnosis not present

## 2020-12-14 DIAGNOSIS — Z30017 Encounter for initial prescription of implantable subdermal contraceptive: Secondary | ICD-10-CM | POA: Diagnosis not present

## 2021-04-27 IMAGING — US US ABDOMEN LIMITED
1 series · 14 of 25 positions shown · non-contrast
Comparison: None.

CLINICAL DATA: Right upper quadrant pain for 2 days

EXAM:
ULTRASOUND ABDOMEN LIMITED RIGHT UPPER QUADRANT

[Series 1: us abdomen limited ruq · 14 of 38 slices shown]
[im 1/38]
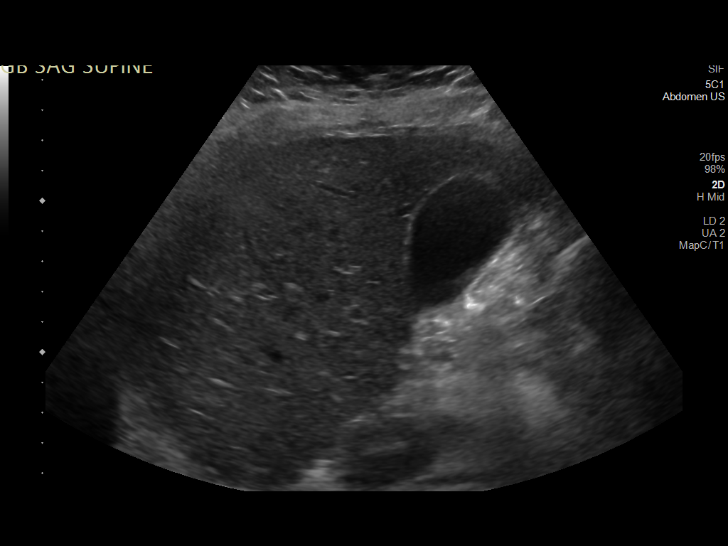
[im 4/38]
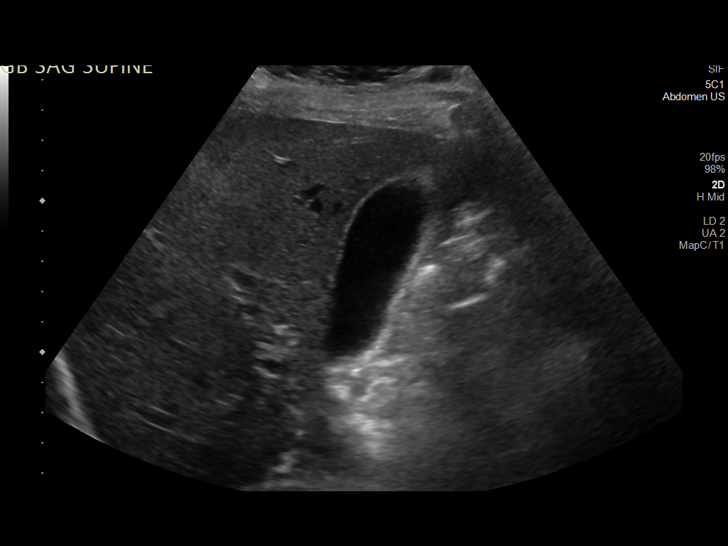
[im 7/38]
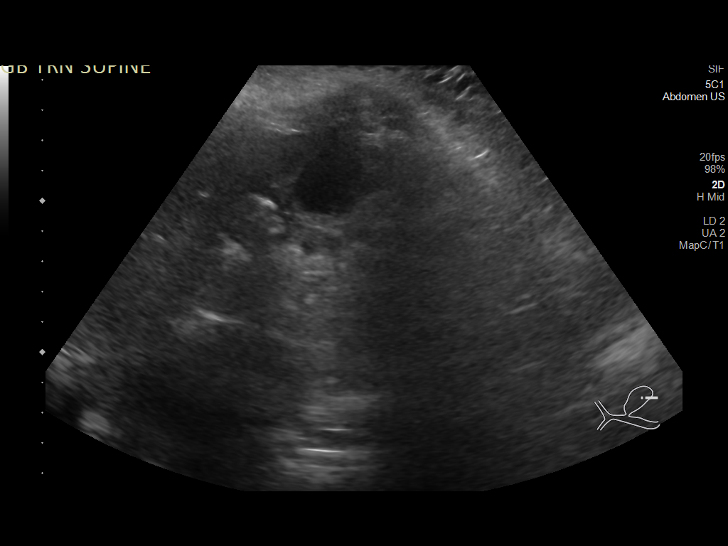
[im 10/38]
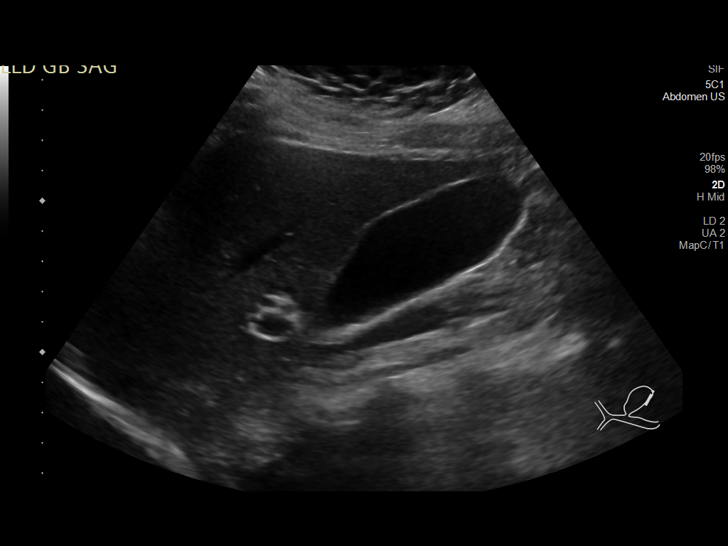
[im 13/38]
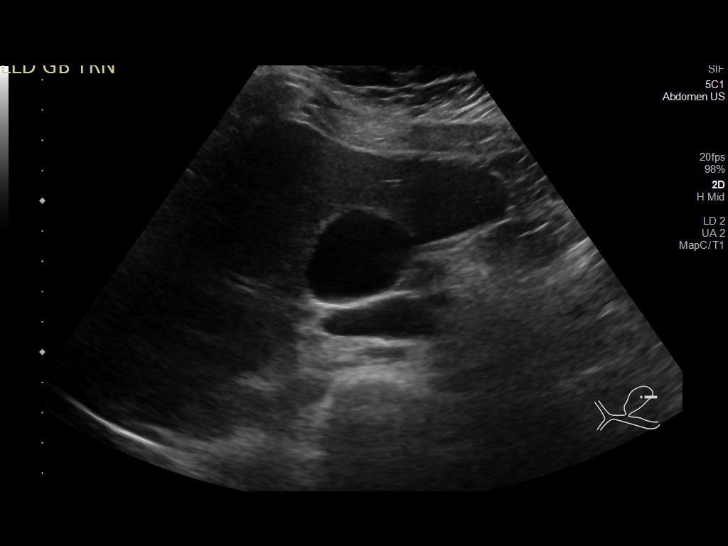
[im 14/38]
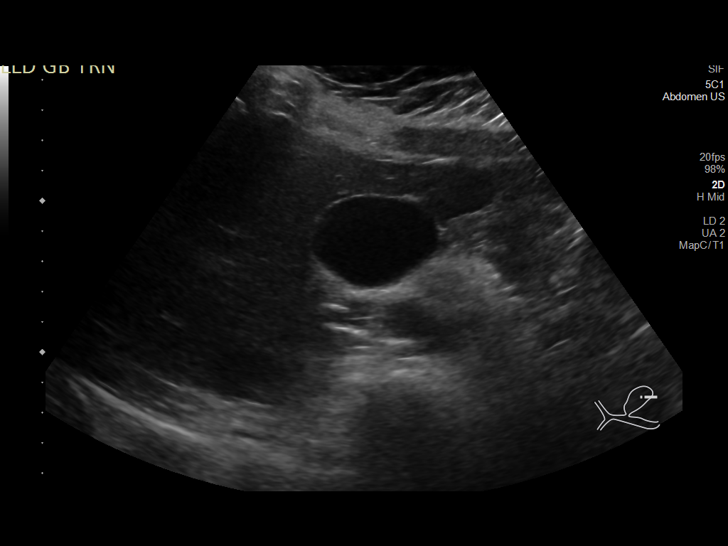
[im 17/38]
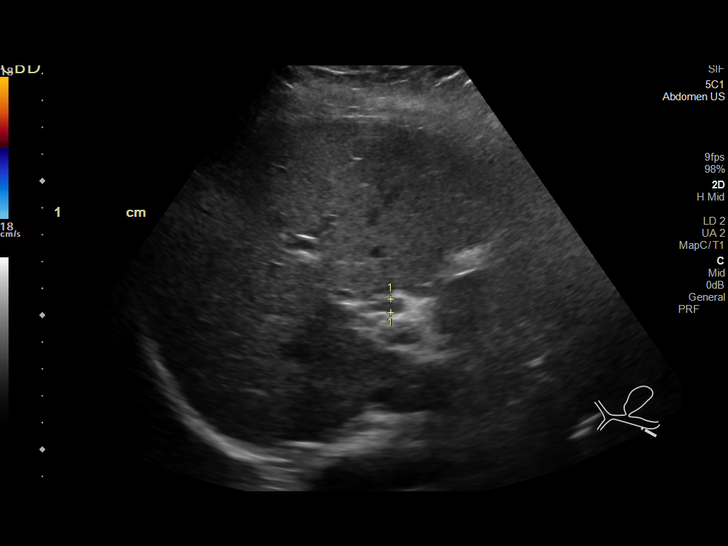
[im 21/38]
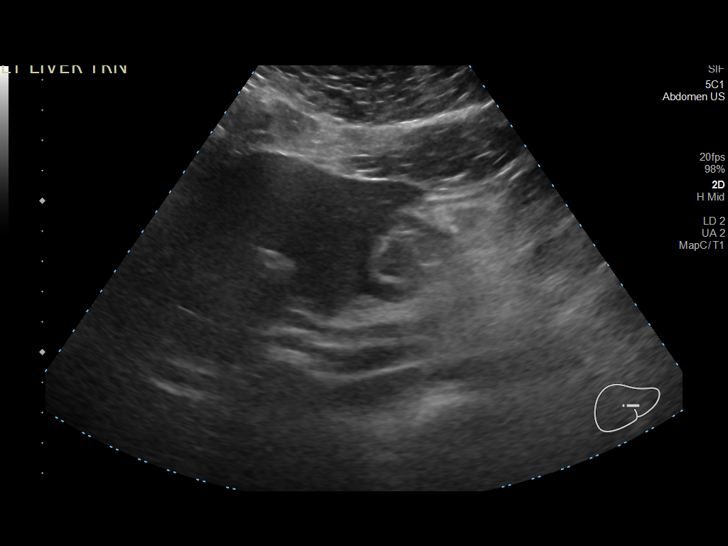
[im 24/38]
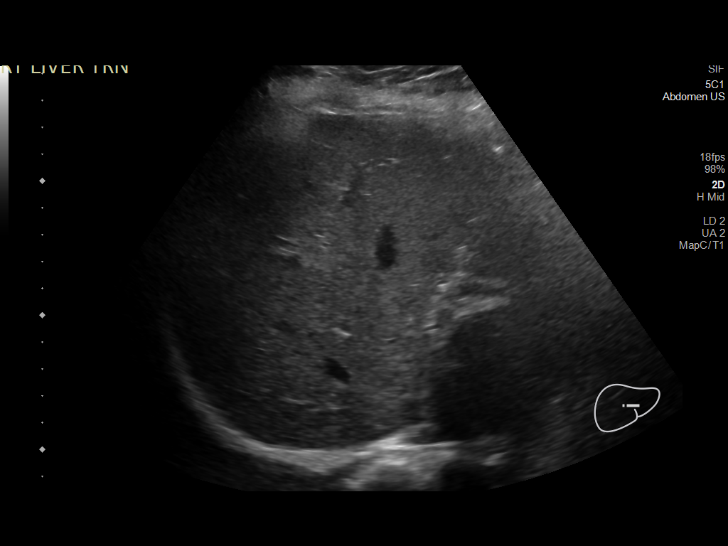
[im 25/38]
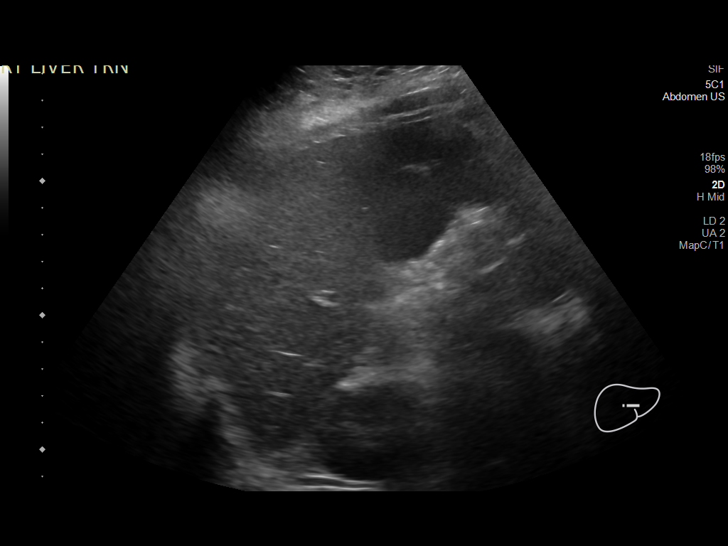
[im 28/38]
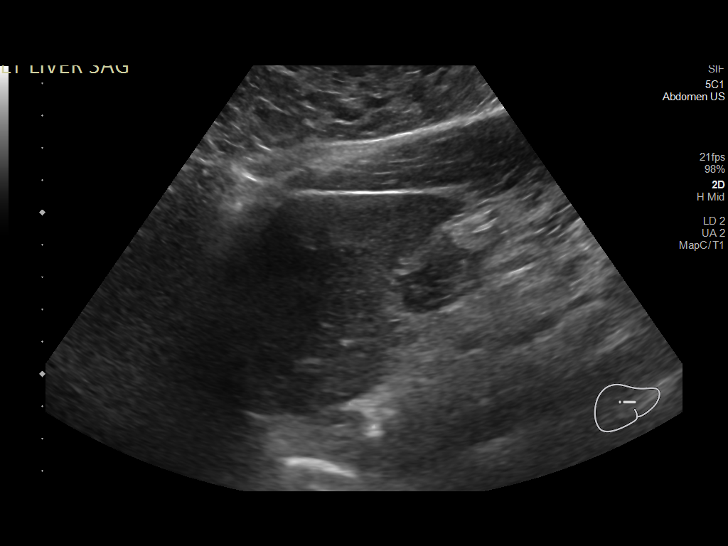
[im 31/38]
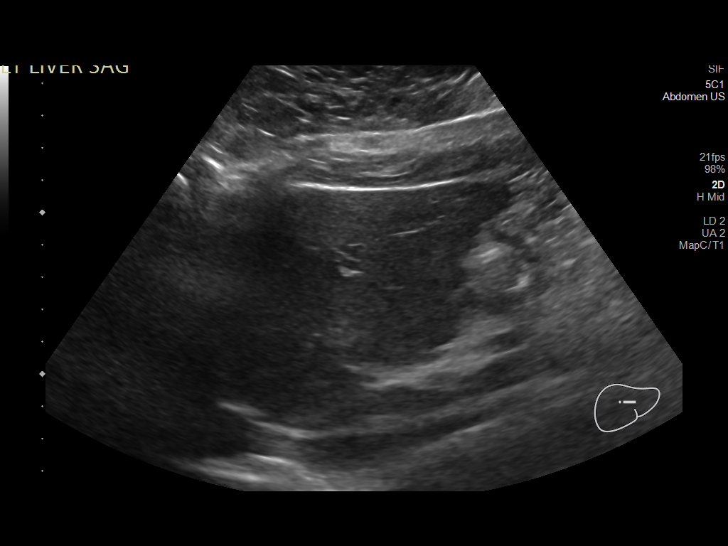
[im 34/38]
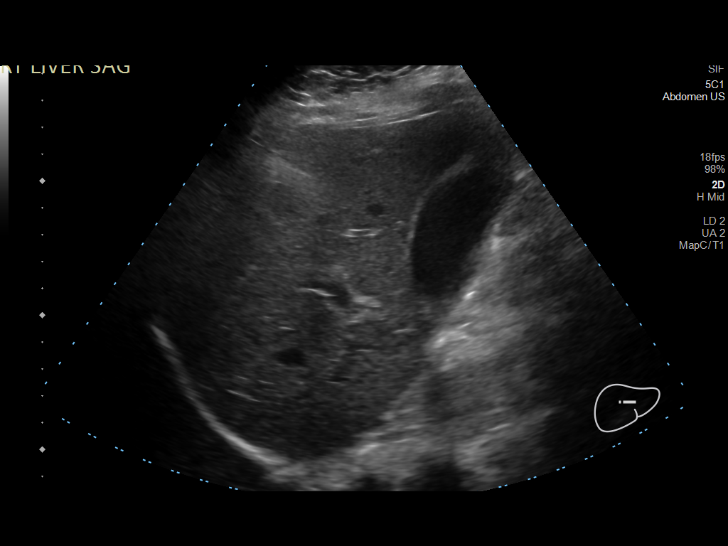
[im 38/38]
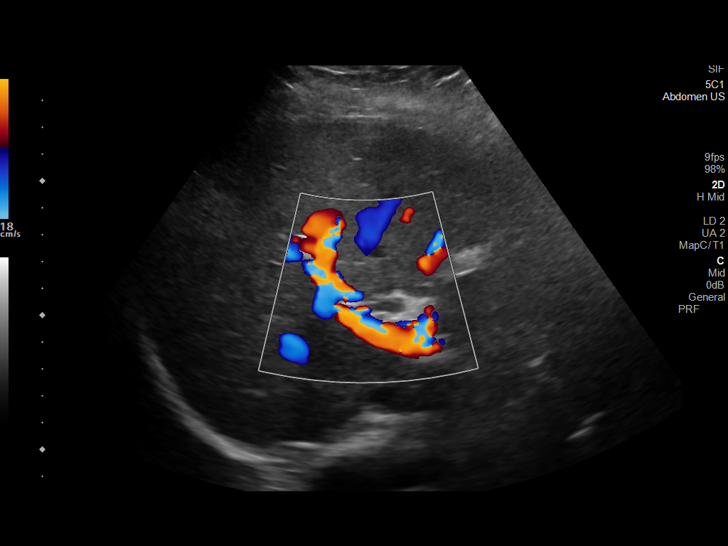

[14 of 25 positions shown; findings below may reference images not displayed]

FINDINGS: Gallbladder:

No gallstones or wall thickening visualized. No sonographic Murphy
sign noted by sonographer.

Common bile duct:

Diameter: 5 mm

Liver:

No focal lesion identified. Within normal limits in parenchymal
echogenicity. Portal vein is patent on color Doppler imaging with
normal direction of blood flow towards the liver.

Other: None.
IMPRESSION: Normal examination

## 2021-05-13 DIAGNOSIS — Z0000 Encounter for general adult medical examination without abnormal findings: Secondary | ICD-10-CM | POA: Diagnosis not present

## 2021-05-13 DIAGNOSIS — Z6838 Body mass index (BMI) 38.0-38.9, adult: Secondary | ICD-10-CM | POA: Diagnosis not present

## 2021-05-13 DIAGNOSIS — E669 Obesity, unspecified: Secondary | ICD-10-CM | POA: Diagnosis not present

## 2021-07-25 DIAGNOSIS — Z124 Encounter for screening for malignant neoplasm of cervix: Secondary | ICD-10-CM | POA: Diagnosis not present

## 2021-07-25 DIAGNOSIS — Z3202 Encounter for pregnancy test, result negative: Secondary | ICD-10-CM | POA: Diagnosis not present

## 2021-07-25 DIAGNOSIS — Z113 Encounter for screening for infections with a predominantly sexual mode of transmission: Secondary | ICD-10-CM | POA: Diagnosis not present

## 2021-07-25 DIAGNOSIS — Z6839 Body mass index (BMI) 39.0-39.9, adult: Secondary | ICD-10-CM | POA: Diagnosis not present

## 2021-07-25 DIAGNOSIS — Z01419 Encounter for gynecological examination (general) (routine) without abnormal findings: Secondary | ICD-10-CM | POA: Diagnosis not present

## 2021-08-15 DIAGNOSIS — B309 Viral conjunctivitis, unspecified: Secondary | ICD-10-CM | POA: Diagnosis not present

## 2021-08-15 DIAGNOSIS — Z713 Dietary counseling and surveillance: Secondary | ICD-10-CM | POA: Diagnosis not present

## 2021-08-15 DIAGNOSIS — Z6837 Body mass index (BMI) 37.0-37.9, adult: Secondary | ICD-10-CM | POA: Diagnosis not present

## 2021-08-29 DIAGNOSIS — Z3202 Encounter for pregnancy test, result negative: Secondary | ICD-10-CM | POA: Diagnosis not present

## 2021-08-29 DIAGNOSIS — Z30430 Encounter for insertion of intrauterine contraceptive device: Secondary | ICD-10-CM | POA: Diagnosis not present

## 2021-08-29 DIAGNOSIS — Z3046 Encounter for surveillance of implantable subdermal contraceptive: Secondary | ICD-10-CM | POA: Diagnosis not present

## 2021-10-13 DIAGNOSIS — Z30431 Encounter for routine checking of intrauterine contraceptive device: Secondary | ICD-10-CM | POA: Diagnosis not present

## 2022-12-20 DIAGNOSIS — Z01419 Encounter for gynecological examination (general) (routine) without abnormal findings: Secondary | ICD-10-CM | POA: Diagnosis not present

## 2022-12-20 DIAGNOSIS — Z6841 Body Mass Index (BMI) 40.0 and over, adult: Secondary | ICD-10-CM | POA: Diagnosis not present

## 2022-12-20 DIAGNOSIS — R319 Hematuria, unspecified: Secondary | ICD-10-CM | POA: Diagnosis not present

## 2023-01-03 DIAGNOSIS — R635 Abnormal weight gain: Secondary | ICD-10-CM | POA: Diagnosis not present

## 2023-01-03 DIAGNOSIS — Z6841 Body Mass Index (BMI) 40.0 and over, adult: Secondary | ICD-10-CM | POA: Diagnosis not present

## 2023-01-03 DIAGNOSIS — Z131 Encounter for screening for diabetes mellitus: Secondary | ICD-10-CM | POA: Diagnosis not present

## 2023-01-03 DIAGNOSIS — R6882 Decreased libido: Secondary | ICD-10-CM | POA: Diagnosis not present

## 2023-01-03 DIAGNOSIS — Z136 Encounter for screening for cardiovascular disorders: Secondary | ICD-10-CM | POA: Diagnosis not present

## 2023-01-03 DIAGNOSIS — E559 Vitamin D deficiency, unspecified: Secondary | ICD-10-CM | POA: Diagnosis not present

## 2023-01-03 DIAGNOSIS — R5383 Other fatigue: Secondary | ICD-10-CM | POA: Diagnosis not present

## 2023-01-10 DIAGNOSIS — Z6841 Body Mass Index (BMI) 40.0 and over, adult: Secondary | ICD-10-CM | POA: Diagnosis not present

## 2023-01-10 DIAGNOSIS — E559 Vitamin D deficiency, unspecified: Secondary | ICD-10-CM | POA: Diagnosis not present

## 2023-01-10 DIAGNOSIS — E7800 Pure hypercholesterolemia, unspecified: Secondary | ICD-10-CM | POA: Diagnosis not present

## 2023-01-10 DIAGNOSIS — Z1331 Encounter for screening for depression: Secondary | ICD-10-CM | POA: Diagnosis not present

## 2023-01-24 DIAGNOSIS — E7800 Pure hypercholesterolemia, unspecified: Secondary | ICD-10-CM | POA: Diagnosis not present

## 2023-01-24 DIAGNOSIS — Z6841 Body Mass Index (BMI) 40.0 and over, adult: Secondary | ICD-10-CM | POA: Diagnosis not present

## 2023-01-31 DIAGNOSIS — Z6839 Body mass index (BMI) 39.0-39.9, adult: Secondary | ICD-10-CM | POA: Diagnosis not present

## 2023-01-31 DIAGNOSIS — E559 Vitamin D deficiency, unspecified: Secondary | ICD-10-CM | POA: Diagnosis not present

## 2023-02-07 DIAGNOSIS — E7800 Pure hypercholesterolemia, unspecified: Secondary | ICD-10-CM | POA: Diagnosis not present

## 2023-02-07 DIAGNOSIS — Z6838 Body mass index (BMI) 38.0-38.9, adult: Secondary | ICD-10-CM | POA: Diagnosis not present

## 2023-02-14 DIAGNOSIS — E559 Vitamin D deficiency, unspecified: Secondary | ICD-10-CM | POA: Diagnosis not present

## 2023-02-14 DIAGNOSIS — Z6838 Body mass index (BMI) 38.0-38.9, adult: Secondary | ICD-10-CM | POA: Diagnosis not present

## 2023-02-21 DIAGNOSIS — Z6838 Body mass index (BMI) 38.0-38.9, adult: Secondary | ICD-10-CM | POA: Diagnosis not present

## 2023-02-21 DIAGNOSIS — E7800 Pure hypercholesterolemia, unspecified: Secondary | ICD-10-CM | POA: Diagnosis not present

## 2023-02-28 DIAGNOSIS — E559 Vitamin D deficiency, unspecified: Secondary | ICD-10-CM | POA: Diagnosis not present

## 2023-02-28 DIAGNOSIS — Z6838 Body mass index (BMI) 38.0-38.9, adult: Secondary | ICD-10-CM | POA: Diagnosis not present

## 2023-03-14 DIAGNOSIS — E7800 Pure hypercholesterolemia, unspecified: Secondary | ICD-10-CM | POA: Diagnosis not present

## 2023-03-14 DIAGNOSIS — Z6838 Body mass index (BMI) 38.0-38.9, adult: Secondary | ICD-10-CM | POA: Diagnosis not present

## 2023-03-14 DIAGNOSIS — R399 Unspecified symptoms and signs involving the genitourinary system: Secondary | ICD-10-CM | POA: Diagnosis not present

## 2023-03-14 DIAGNOSIS — E559 Vitamin D deficiency, unspecified: Secondary | ICD-10-CM | POA: Diagnosis not present

## 2023-04-04 DIAGNOSIS — E7800 Pure hypercholesterolemia, unspecified: Secondary | ICD-10-CM | POA: Diagnosis not present

## 2023-04-04 DIAGNOSIS — Z6838 Body mass index (BMI) 38.0-38.9, adult: Secondary | ICD-10-CM | POA: Diagnosis not present

## 2023-04-12 DIAGNOSIS — Z6837 Body mass index (BMI) 37.0-37.9, adult: Secondary | ICD-10-CM | POA: Diagnosis not present

## 2023-04-12 DIAGNOSIS — E559 Vitamin D deficiency, unspecified: Secondary | ICD-10-CM | POA: Diagnosis not present

## 2023-04-12 DIAGNOSIS — E7800 Pure hypercholesterolemia, unspecified: Secondary | ICD-10-CM | POA: Diagnosis not present

## 2024-05-25 ENCOUNTER — Other Ambulatory Visit: Payer: Self-pay

## 2024-05-25 ENCOUNTER — Encounter (HOSPITAL_COMMUNITY): Payer: Self-pay

## 2024-05-25 ENCOUNTER — Inpatient Hospital Stay (HOSPITAL_COMMUNITY)
Admission: AD | Admit: 2024-05-25 | Discharge: 2024-05-25 | Disposition: A | Discharge status: Home / Self Care | Attending: Obstetrics and Gynecology | Admitting: Obstetrics and Gynecology

## 2024-05-25 DIAGNOSIS — N898 Other specified noninflammatory disorders of vagina: Secondary | ICD-10-CM | POA: Diagnosis present

## 2024-05-25 DIAGNOSIS — Z3A21 21 weeks gestation of pregnancy: Secondary | ICD-10-CM | POA: Diagnosis not present

## 2024-05-25 DIAGNOSIS — Z0371 Encounter for suspected problem with amniotic cavity and membrane ruled out: Secondary | ICD-10-CM | POA: Insufficient documentation

## 2024-05-25 LAB — WET PREP, GENITAL
Clue Cells Wet Prep HPF POC: NONE SEEN
Sperm: NONE SEEN
Trich, Wet Prep: NONE SEEN
WBC, Wet Prep HPF POC: <10
Yeast Wet Prep HPF POC: NONE SEEN

## 2024-05-25 LAB — POCT FERN TEST
POCT Fern Test: NEGATIVE
POCT Fern Test: NEGATIVE

## 2024-05-25 NOTE — MAU Provider Note (Signed)
 " History     CSN: [id number]  Arrival date and time: 05/25/24 1120   Event Date/Time   First Provider Initiated Contact with Patient 05/25/24 1231      Chief Complaint  Patient presents with   Rupture of Membranes   HPI Leslie Leonard is a 24 y.o. G2P1001 at [redacted]w[redacted]d who presents with LOF. Reports episodes of watery discharge since Thursday. Has had wet spots in her underwear. Believes it to be clear & watery, without odor. Denies vaginal bleeding, vaginal irritation, abdominal pain, dysuria, fever, or recent intercourse. +FM. Denies complications with the pregnancy. Next appt is 2/13.   OB History     Gravida  2   Para  1   Term  1   Preterm      AB      Living  1      SAB      IAB      Ectopic      Multiple  0   Live Births  1           Past Medical History:  Diagnosis Date   Medical history non-contributory     Past Surgical History:  Procedure Laterality Date   NO PAST SURGERIES      No family history on file.  Social History[1]  Allergies: Allergies[2]  Medications Prior to Admission  Medication Sig Dispense Refill Last Dose/Taking   Prenatal Vit-Fe Fumarate-FA (PRENATAL MULTIVITAMIN) TABS tablet Take 1 tablet by mouth daily at 12 noon.   05/25/2024   ibuprofen  (ADVIL ) 600 MG tablet Take 1 tablet (600 mg total) by mouth every 6 (six) hours. (Patient not taking: Reported on 05/25/2024) 30 tablet 0 Not Taking    Review of Systems  All other systems reviewed and are negative.  Physical Exam   Blood pressure 122/67, pulse 82, temperature 97.7 F (36.5 C), temperature source Oral, resp. rate 16, height 5' 4 (1.626 m), weight 102.8 kg, SpO2 99%, unknown if currently breastfeeding.  Physical Exam Vitals and nursing note reviewed. Exam conducted with a chaperone present.  Constitutional:      General: She is not in acute distress.    Appearance: Normal appearance. She is not ill-appearing.  HENT:     Head: Normocephalic and atraumatic.  Eyes:      General: No scleral icterus.    Pupils: Pupils are equal, round, and reactive to light.  Pulmonary:     Effort: Pulmonary effort is normal. No respiratory distress.  Abdominal:     Tenderness: There is no abdominal tenderness.     Comments: gravid  Genitourinary:    Comments: SSE: scant amount of mucoid white discharge. No pooling. No blood. Cervix visually closed.  Skin:    General: Skin is warm and dry.  Neurological:     Mental Status: She is alert.      Pt informed that the ultrasound is considered a limited OB ultrasound and is not intended to be a complete ultrasound exam.  Patient also informed that the ultrasound is not being completed with the intent of assessing for fetal or placental anomalies or any pelvic abnormalities.  Explained that the purpose of todays ultrasound is to assess for  AFI.  Patient acknowledges the purpose of the exam and the limitations of the study.   MVP 6.3 cm    MAU Course  Procedures Results for orders placed or performed during the hospital encounter of 05/25/24 (from the past 24 hours)  [facility]  Status: None   Collection Time: 05/25/24 12:06 PM  Result Value Ref Range   POCT Fern Test Negative = intact amniotic membranes   Wet prep, genital     Status: None   Collection Time: 05/25/24 12:41 PM  Result Value Ref Range   Yeast Wet Prep HPF POC NONE SEEN NONE SEEN   Trich, Wet Prep NONE SEEN NONE SEEN   Clue Cells Wet Prep HPF POC NONE SEEN NONE SEEN   WBC, Wet Prep HPF POC <10 <10   Sperm NONE SEEN   POCT fern test     Status: None   Collection Time: 05/25/24  1:13 PM  Result Value Ref Range   POCT Fern Test Negative = intact amniotic membranes     MDM   Assessment and Plan   1. Encounter for suspected PROM, with rupture of membranes not found   2. [redacted] weeks gestation of pregnancy    -Presents with watery vaginal discharge since Thursday.  FHT present via doppler.  -SSE performed. No pooling of fluid, abnormal  discharge, or blood. Cervix visually closed. Fern negative -BSUS for fluid evaluation, MVP 6.3 cm -Reviewed reasons to return to MAU. GC/CT pending.   Rocky Satterfield 05/25/2024, 12:44 PM      [1]  Social History Tobacco Use   Smoking status: Never   Smokeless tobacco: Never  Vaping Use   Vaping status: Never Used  Substance Use Topics   Alcohol use: Never   Drug use: Never  [2] No Known Allergies  "

## 2024-05-25 NOTE — MAU Note (Signed)
 Leslie Leonard is a 24 y.o. at [redacted]w[redacted]d here in MAU reporting: the past few days underwear has had wet spots on them when she sits or stands. States it is clear and watery, no mucus. Does not have an odor. Denies any abdominal pain. Denies any VB. Denies any recent sexual intercourse. Patient denies any unusual vaginal discharge, vaginal odor, itching or pain.    Onset of complaint: Thursday Pain score: 0 Vitals:   05/25/24 1138  BP: 113/61  Pulse: 75  Resp: 16  Temp: 97.7 F (36.5 C)  SpO2: 99%     FHT:138 Lab orders placed from triage:

## 2024-05-26 LAB — GC/CHLAMYDIA PROBE AMP (~~LOC~~) NOT AT ARMC
Chlamydia: NEGATIVE
Comment: NEGATIVE
Comment: NORMAL
Neisseria Gonorrhea: NEGATIVE
